# Patient Record
Sex: Female | Born: 1935 | Race: White | Hispanic: No | Smoking: Never smoker
Health system: Southern US, Community
[De-identification: ages and names within clinical notes are randomized; demographics above are authoritative.]

## PROBLEM LIST (undated history)

## (undated) DIAGNOSIS — N189 Chronic kidney disease, unspecified: Secondary | ICD-10-CM

## (undated) DIAGNOSIS — I10 Essential (primary) hypertension: Secondary | ICD-10-CM

## (undated) DIAGNOSIS — I639 Cerebral infarction, unspecified: Secondary | ICD-10-CM

## (undated) DIAGNOSIS — D509 Iron deficiency anemia, unspecified: Secondary | ICD-10-CM

## (undated) DIAGNOSIS — M545 Low back pain, unspecified: Secondary | ICD-10-CM

## (undated) DIAGNOSIS — E079 Disorder of thyroid, unspecified: Secondary | ICD-10-CM

## (undated) HISTORY — PX: TOTAL HIP ARTHROPLASTY: SHX124

## (undated) HISTORY — PX: TOTAL ABDOMINAL HYSTERECTOMY: SHX209

## (undated) HISTORY — PX: CATARACT EXTRACTION: SUR2

---

## 1998-04-22 ENCOUNTER — Ambulatory Visit (HOSPITAL_COMMUNITY): Admission: RE | Admit: 1998-04-22 | Discharge: 1998-04-22 | Payer: Self-pay | Admitting: *Deleted

## 1999-04-09 ENCOUNTER — Encounter: Payer: Self-pay | Admitting: Orthopaedic Surgery

## 1999-04-09 ENCOUNTER — Encounter: Admission: RE | Admit: 1999-04-09 | Discharge: 1999-04-09 | Payer: Self-pay | Admitting: Orthopaedic Surgery

## 1999-05-07 ENCOUNTER — Ambulatory Visit (HOSPITAL_COMMUNITY): Admission: RE | Admit: 1999-05-07 | Discharge: 1999-05-07 | Payer: Self-pay | Admitting: Orthopaedic Surgery

## 1999-05-07 ENCOUNTER — Encounter: Payer: Self-pay | Admitting: Orthopaedic Surgery

## 1999-05-20 ENCOUNTER — Ambulatory Visit (HOSPITAL_COMMUNITY): Admission: RE | Admit: 1999-05-20 | Discharge: 1999-05-20 | Payer: Self-pay | Admitting: *Deleted

## 1999-05-20 ENCOUNTER — Encounter: Payer: Self-pay | Admitting: Orthopaedic Surgery

## 1999-05-20 ENCOUNTER — Encounter: Admission: RE | Admit: 1999-05-20 | Discharge: 1999-05-20 | Payer: Self-pay | Admitting: Orthopaedic Surgery

## 1999-06-01 ENCOUNTER — Encounter: Payer: Self-pay | Admitting: Orthopaedic Surgery

## 1999-06-01 ENCOUNTER — Encounter: Admission: RE | Admit: 1999-06-01 | Discharge: 1999-06-01 | Payer: Self-pay | Admitting: Orthopaedic Surgery

## 1999-06-10 ENCOUNTER — Ambulatory Visit (HOSPITAL_COMMUNITY): Admission: RE | Admit: 1999-06-10 | Discharge: 1999-06-10 | Payer: Self-pay | Admitting: Gastroenterology

## 1999-06-10 ENCOUNTER — Encounter (INDEPENDENT_AMBULATORY_CARE_PROVIDER_SITE_OTHER): Payer: Self-pay | Admitting: *Deleted

## 1999-06-22 ENCOUNTER — Encounter: Payer: Self-pay | Admitting: General Surgery

## 1999-06-24 ENCOUNTER — Ambulatory Visit (HOSPITAL_COMMUNITY): Admission: RE | Admit: 1999-06-24 | Discharge: 1999-06-25 | Payer: Self-pay | Admitting: General Surgery

## 1999-06-24 ENCOUNTER — Encounter (INDEPENDENT_AMBULATORY_CARE_PROVIDER_SITE_OTHER): Payer: Self-pay | Admitting: *Deleted

## 1999-06-29 ENCOUNTER — Encounter (INDEPENDENT_AMBULATORY_CARE_PROVIDER_SITE_OTHER): Payer: Self-pay | Admitting: Specialist

## 1999-06-29 ENCOUNTER — Inpatient Hospital Stay (HOSPITAL_COMMUNITY): Admission: RE | Admit: 1999-06-29 | Discharge: 1999-07-04 | Payer: Self-pay | Admitting: General Surgery

## 2000-05-24 ENCOUNTER — Encounter: Payer: Self-pay | Admitting: Internal Medicine

## 2000-05-24 ENCOUNTER — Ambulatory Visit (HOSPITAL_COMMUNITY): Admission: RE | Admit: 2000-05-24 | Discharge: 2000-05-24 | Payer: Self-pay | Admitting: Internal Medicine

## 2000-06-29 ENCOUNTER — Encounter (INDEPENDENT_AMBULATORY_CARE_PROVIDER_SITE_OTHER): Payer: Self-pay | Admitting: *Deleted

## 2000-06-29 ENCOUNTER — Ambulatory Visit (HOSPITAL_COMMUNITY): Admission: RE | Admit: 2000-06-29 | Discharge: 2000-06-29 | Payer: Self-pay | Admitting: Gastroenterology

## 2000-07-20 ENCOUNTER — Encounter: Admission: RE | Admit: 2000-07-20 | Discharge: 2000-07-31 | Payer: Self-pay | Admitting: General Surgery

## 2000-07-20 ENCOUNTER — Encounter: Payer: Self-pay | Admitting: General Surgery

## 2000-07-27 ENCOUNTER — Inpatient Hospital Stay (HOSPITAL_COMMUNITY): Admission: RE | Admit: 2000-07-27 | Discharge: 2000-07-31 | Payer: Self-pay | Admitting: General Surgery

## 2000-07-27 ENCOUNTER — Encounter (INDEPENDENT_AMBULATORY_CARE_PROVIDER_SITE_OTHER): Payer: Self-pay | Admitting: Specialist

## 2001-01-04 ENCOUNTER — Ambulatory Visit (HOSPITAL_COMMUNITY): Admission: RE | Admit: 2001-01-04 | Discharge: 2001-01-04 | Payer: Self-pay | Admitting: Gastroenterology

## 2001-03-20 ENCOUNTER — Other Ambulatory Visit: Admission: RE | Admit: 2001-03-20 | Discharge: 2001-03-20 | Payer: Self-pay | Admitting: *Deleted

## 2001-05-25 ENCOUNTER — Encounter: Payer: Self-pay | Admitting: Internal Medicine

## 2001-05-25 ENCOUNTER — Ambulatory Visit (HOSPITAL_COMMUNITY): Admission: RE | Admit: 2001-05-25 | Discharge: 2001-05-25 | Payer: Self-pay | Admitting: Internal Medicine

## 2002-04-02 ENCOUNTER — Ambulatory Visit (HOSPITAL_COMMUNITY): Admission: RE | Admit: 2002-04-02 | Discharge: 2002-04-02 | Payer: Self-pay | Admitting: Gastroenterology

## 2002-05-27 ENCOUNTER — Ambulatory Visit (HOSPITAL_COMMUNITY): Admission: RE | Admit: 2002-05-27 | Discharge: 2002-05-27 | Payer: Self-pay | Admitting: Internal Medicine

## 2002-05-27 ENCOUNTER — Encounter: Payer: Self-pay | Admitting: Internal Medicine

## 2003-04-23 ENCOUNTER — Ambulatory Visit (HOSPITAL_COMMUNITY): Admission: RE | Admit: 2003-04-23 | Discharge: 2003-04-23 | Payer: Self-pay | Admitting: Gastroenterology

## 2003-04-23 ENCOUNTER — Encounter (INDEPENDENT_AMBULATORY_CARE_PROVIDER_SITE_OTHER): Payer: Self-pay | Admitting: *Deleted

## 2003-05-29 ENCOUNTER — Ambulatory Visit (HOSPITAL_COMMUNITY): Admission: RE | Admit: 2003-05-29 | Discharge: 2003-05-29 | Payer: Self-pay | Admitting: Internal Medicine

## 2004-06-21 ENCOUNTER — Ambulatory Visit (HOSPITAL_COMMUNITY): Admission: RE | Admit: 2004-06-21 | Discharge: 2004-06-21 | Payer: Self-pay | Admitting: Internal Medicine

## 2004-07-30 ENCOUNTER — Encounter: Admission: RE | Admit: 2004-07-30 | Discharge: 2004-10-28 | Payer: Self-pay | Admitting: Internal Medicine

## 2004-10-29 ENCOUNTER — Ambulatory Visit (HOSPITAL_COMMUNITY): Admission: RE | Admit: 2004-10-29 | Discharge: 2004-10-29 | Payer: Self-pay | Admitting: Gastroenterology

## 2005-07-14 ENCOUNTER — Ambulatory Visit (HOSPITAL_COMMUNITY): Admission: RE | Admit: 2005-07-14 | Discharge: 2005-07-14 | Payer: Self-pay | Admitting: Internal Medicine

## 2006-08-24 ENCOUNTER — Ambulatory Visit (HOSPITAL_COMMUNITY): Admission: RE | Admit: 2006-08-24 | Discharge: 2006-08-24 | Payer: Self-pay | Admitting: Internal Medicine

## 2006-12-24 ENCOUNTER — Encounter: Admission: RE | Admit: 2006-12-24 | Discharge: 2006-12-24 | Payer: Self-pay | Admitting: Internal Medicine

## 2007-10-10 ENCOUNTER — Encounter: Admission: RE | Admit: 2007-10-10 | Discharge: 2007-10-10 | Payer: Self-pay | Admitting: Specialist

## 2007-10-16 ENCOUNTER — Ambulatory Visit (HOSPITAL_COMMUNITY): Admission: RE | Admit: 2007-10-16 | Discharge: 2007-10-16 | Payer: Self-pay | Admitting: Internal Medicine

## 2007-10-22 ENCOUNTER — Encounter: Admission: RE | Admit: 2007-10-22 | Discharge: 2007-10-22 | Payer: Self-pay | Admitting: Internal Medicine

## 2008-11-10 ENCOUNTER — Ambulatory Visit (HOSPITAL_COMMUNITY): Admission: RE | Admit: 2008-11-10 | Discharge: 2008-11-10 | Payer: Self-pay | Admitting: Internal Medicine

## 2010-06-27 ENCOUNTER — Encounter: Payer: Self-pay | Admitting: Internal Medicine

## 2010-08-10 ENCOUNTER — Other Ambulatory Visit (HOSPITAL_COMMUNITY): Payer: Self-pay | Admitting: Internal Medicine

## 2010-08-10 DIAGNOSIS — Z1231 Encounter for screening mammogram for malignant neoplasm of breast: Secondary | ICD-10-CM

## 2010-08-17 ENCOUNTER — Ambulatory Visit (HOSPITAL_COMMUNITY)
Admission: RE | Admit: 2010-08-17 | Discharge: 2010-08-17 | Disposition: A | Discharge status: Home / Self Care | Payer: Medicare Other | Source: Ambulatory Visit | Attending: Internal Medicine | Admitting: Internal Medicine

## 2010-08-17 DIAGNOSIS — Z1231 Encounter for screening mammogram for malignant neoplasm of breast: Secondary | ICD-10-CM | POA: Insufficient documentation

## 2010-10-22 NOTE — Op Note (Signed)
NAME:  Brandy Barnett, Brandy Barnett                          ACCOUNT NO.:  [id number]   MEDICAL RECORD NO.:  [id number]                   PATIENT TYPE:  AMB   LOCATION:  ENDO                                 FACILITY:  [facility]   PHYSICIAN:  Anselmo Rod, M.D.               DATE OF BIRTH:  [birth year]   DATE OF PROCEDURE:  04/23/2003  DATE OF DISCHARGE:                                 OPERATIVE REPORT   PROCEDURE:  Colonoscopy with biopsies.   ENDOSCOPIST:  Charna Elizabeth, M.D.   INSTRUMENT USED:  Olympus video colonoscope.   INDICATIONS FOR PROCEDURE:  77 year old white female with a history of stage  II colon cancer and a personal history of colonic polyps resulting in  sigmoid colectomy and right sided adenomatous polyp resulting in right sided  colectomy in 2001 and 2002 respectively.  The patient is undergoing repeat  colonoscopy for blood in stool, rule out recurrent polyps.   PREPROCEDURE PREPARATION:  Informed consent was obtained from the patient.  The patient was fasted for eight hours prior to the procedure and prepped  with a bottle of magnesium citrate and a gallon of GoLYTELY the night prior  to the procedure.   PREPROCEDURE PHYSICAL:  Patient with stable vital signs.  Neck supple.  Chest clear to auscultation.  S1 and S2 regular.  Abdomen soft with normal  bowel sounds.   DESCRIPTION OF PROCEDURE:  The patient was placed in the left lateral  decubitus position, sedated with 60 mg of Demerol and 6 mg Versed  intravenously.  Once the patient was adequately sedated, maintained on low  flow oxygen, continuous cardiac monitoring, the Olympus video colonoscope  was advanced into the rectum to the anastomosis at 100 cm without  difficulty.  The patient had some residual stool in the colon, multiple  washings were done.  Healthy anastomosis was noted at 25 and 100 cm from the  sigmoid colectomy and right colectomy respectively.  The distal small bowel  appeared normal.  A small patch of  erythema was biopsied at 80 cm.  Small  internal hemorrhoids were seen on retroflexion.  The patient has some skin  tags around the anus.  No other masses or polyps are identified.  The  patient tolerated the procedure well without complications.   IMPRESSION:  1. Healthy anastomosis noted at 25 and 100 cm.  2. Small patch of erythema biopsied at 80 cm.  3. Small internal hemorrhoids.  4. Anal skin tags.   RECOMMENDATIONS:  1. Await pathology results  2. Avoid all nonsteroidals including aspirin for now.  3. Outpatient follow up in the next two weeks for further recommendations.  Anselmo Rod, M.D.    JNM/MEDQ  D:  04/23/2003  T:  04/24/2003  Job:  [id number]   cc:   Adolph Pollack, M.D.  [address], [address]  [place]  [place] [address]  Fax: [phone]   Soyla Murphy. Renne Crigler, M.D.  [address]  [place]  [place] [address]  Fax: [phone]

## 2010-10-22 NOTE — Procedures (Signed)
[place]. [facility]  Patient:    Brandy Barnett, Brandy Barnett                       MRN: [id number] Proc. Date: 01/04/01 Attending:  Anselmo Rod, M.D. CC:         Jenel Lucks, M.D.  Adolph Pollack, M.D.   Procedure Report  DATE OF BIRTH:  11/30/1932  REFERRING PHYSICIAN:  Jenel Lucks, M.D.  PROCEDURE PERFORMED:  Colonoscopy to rule out recurrent polyps.  ENDOSCOPIST:  Anselmo Rod, M.D.  INSTRUMENT USED:  Olympus video colonoscope.  INDICATIONS FOR PROCEDURE:  The patient is a 77 year old white female who has had left hemicolectomy for invasive adenocarcinoma and a right hemicolectomy with side-to-side anastomosis for villous adenoma of the cecum.  The patient also had a portion of her distal small bowel resected.  PREPROCEDURE PREPARATION:  Informed consent was procured from the patient. The patient was fasted for eight hours prior to the procedure and prepped with a bottle of magnesium citrate and a gallon of NuLytely the night prior to the procedure.  PREPROCEDURE PHYSICAL:  The patient had stable vital signs.  Neck supple. Chest clear to auscultation.  S1, S2 regular.  Abdomen soft with normal abdominal bowel sounds.  There is a well-healed surgical scar from previous surgeries.  DESCRIPTION OF PROCEDURE:  The patient was placed in the left lateral decubitus position and sedated with 50 mg of Demerol and 5 mg of Versed intravenously.  Once the patient was adequately sedated and maintained on low-flow oxygen and continuous cardiac monitoring, the Olympus video colonoscope was advanced from the rectum to the right colon up to 80 cm without difficulty.  The patient had a fairly good prep.  A healthy anastomosis was seen at 80 cm with side-to-side anastomosis of the terminal portion of the small bowel intubated and appeared healthy and without lesions.  IMPRESSION:  Healthy-appearing colon.  Patient status post right and left hemicolectomy  for reasons mentioned above.  RECOMMENDATIONS:  Repeat colorectal cancer screening has been recommended in the next year and then follow-up in the next three years unless the patient were to develop any abnormal symptoms in the interim. DD:  01/04/01 TD:  01/04/01 Job: [id number]

## 2010-10-22 NOTE — Procedures (Signed)
[place]. [facility]  Patient:    Brandy Barnett                          MRN: [id number] Proc. Date: 06/10/99 Adm. Date:  [id number] Attending:  Charna Elizabeth CC:         Warrick Parisian, M.D.                           Procedure Report  DATE OF BIRTH:  Aug 02, 1932  REFERRING PHYSICIAN:  Warrick Parisian, M.D.  PROCEDURE PERFORMED:  Colonoscopy with biopsies.  ENDOSCOPIST:  Anselmo Rod, M.D.  INSTRUMENT USED:  Olympus video colonoscope.  INDICATIONS:  Rectal bleeding in a 77 year old white female.  Rule out polyps, masses, hemorrhoids, etc.  PREPROCEDURE PREPARATION:  Informed consent was procured from the patient.  The  patient was fasted for 8 hours prior to the procedure and prepped with a bottle of magnesium citrate and a gallon of NuLytely the night prior to the procedure.  PREPROCEDURE PHYSICAL:  Patient has stable vital signs.  NECK: Supple.  CHEST:  Clear to auscultation. S1, S2 regular.  ABDOMEN:  Soft with normal abdominal bowel sounds.  DESCRIPTION OF PROCEDURE:  The patient was placed in left lateral decubitus position and sedated with 50 mg of Demerol and 5 mg of Versed intravenously. nce the patient was adequately sedated and maintained on low-flow oxygen and continuous cardiac monitoring, the Olympus video colonoscope was advanced from the rectum o the cecum without difficulty.  An inflamed 3-4 mm area was seen in the left colon around 30 cm.  This was biopsied for pathology.  There some blood oozing from the center of this lesion.  The exact nature of this lesion could not be estimated y gross looks of this lesion and the rest of the colonic mucosa appeared healthy p to the cecum.  The patient had some redundant skin around the anus.  No other abnormalities were seen.  Biopsies were done from this lesion at 30 cm.  The patient tolerated the procedure well.  There was a large amount of residual stool in  dependent areas of the colon, therefore, very small lesions could have been easily missed.  IMPRESSION:  A 3-4 mm ______ lesion in left colon at 30 cm with easy friability  and small amount of blood oozing from the center of the lesion.   Biopsies done, results pending.  RECOMMENDATIONS: 1. Await pathology results. 2. Avoid nonsteroidals for now. 3. Outpatient follow-up for further recommendations. DD:  06/10/99 TD:  06/10/99 Job: [id number]

## 2010-10-22 NOTE — Op Note (Signed)
NAME:  Brandy Barnett, Brandy Barnett                          ACCOUNT NO.:  [id number]   MEDICAL RECORD NO.:  [id number]                   PATIENT TYPE:  AMB   LOCATION:  ENDO                                 FACILITY:  [facility]   PHYSICIAN:  Anselmo Rod, M.D.               DATE OF BIRTH:  02/19/1933   DATE OF PROCEDURE:  04/02/2002  DATE OF DISCHARGE:                                 OPERATIVE REPORT   PROCEDURE:  Screening colonoscopy.   ENDOSCOPIST:  Anselmo Rod, M.D.   INSTRUMENT USED:  Olympus video colonoscope (pediatric intestinal scope).   INDICATION FOR PROCEDURE:  Personal history of adenocarcinoma of the colon,  status post sigmoid colectomy in 2001, followed by a right colectomy for  large adenomatous polyp in 2002.  The patient is here for a one-year  screening colonoscopy.  Rule out colonic polyps, etc.   PREPROCEDURE PREPARATION:  Informed consent was procured from the patient.  The patient was fasted for eight hours prior to the procedure and prepped  with a bottle of magnesium citrate and a gallon of NuLytely the night prior  to the procedure.   PREPROCEDURE PHYSICAL:  VITAL SIGNS:  The patient had stable vital signs.  NECK:  Supple.  CHEST:  Clear to auscultation.  S1, S2 regular.  ABDOMEN:  Soft with normal bowel sounds.  Well-healed surgical scars present  from above-mentioned surgeries.   DESCRIPTION OF PROCEDURE:  The patient was placed in the left lateral  decubitus position and sedated with 70 mg of Demerol and 7 mg of Versed  intravenously.  Once the patient was adequately sedate and maintained on low-  flow oxygen and continuous cardiac monitoring, the Olympus video colonoscope  was advanced from the rectum to the terminal ileum without difficulty.  A  well-healed anastomosis was noted at 20 cm, and a healthy anastomosis was  noted at 90 cm.  The patient had a terminal ileum and right colon resection  last year.  No polyps, masses, erosions, ulcerations, etc.,  were seen.   IMPRESSION:  A healthy-appearing colon with anastomoses noted at 20 cm and  90 cm.  No masses or polyps seen.   RECOMMENDATIONS:  1. Considering the patient's history of adenomatous polyps and     adenocarcinoma, repeat colorectal cancer     screening is recommended in the next three years unless the patient     develops any abnormal symptoms in the interim.  2. Outpatient follow-up on a p.r.n. basis.                                               Anselmo Rod, M.D.    JNM/MEDQ  D:  04/02/2002  T:  04/02/2002  Job:  [id number]   cc:   Soyla Murphy.  Renne Crigler, M.D.  [address]  [place]  [place] [address]  Fax: [phone]   Adolph Pollack, M.D.  Fax: [phone]

## 2010-10-22 NOTE — Procedures (Signed)
[place]. [facility]  Patient:    Brandy Barnett                          MRN: [id number] Proc. Date: 06/24/99 Adm. Date:  [id number] Attending:  Arlis Porta CC:         Warrick Parisian, M.D.             Adolph Pollack, M.D.                           Procedure Report  DATE OF BIRTH:  07/05/1932  PROCEDURE PERFORMED:  Flexible sigmoidoscopy with biopsies.  ENDOSCOPIST:  Anselmo Rod, M.D.  INSTRUMENT USED:  Olympus video colonoscope.  INDICATIONS:  This 77 year old white female with invasive adenocarcinoma diagnosed in the left colon.  Plans are to tattoo this area prior to surgery which is scheduled for later today.  PREPROCEDURE PREPARATION:  An informed consent was procured from the patient and the patient was fasted for eight hours prior to the procedure, and prepped with a gallon of NuLYTELY on the night prior to the procedure.  PREPROCEDURE PHYSICAL EXAMINATION:  VITAL SIGNS:  The patient had stable vital signs.  NECK:  Supple.  CHEST:  Clear to auscultation.  HEART:  S1, S2 regular.  ABDOMEN:  Soft with normal abdominal bowel sounds.  DESCRIPTION OF PROCEDURE:  The patient was placed in the left lateral decubitus  position and sedated with 60 mg of Demerol and 4 mg of Versed intravenously. Once the patient was adequately sedated and maintained on low-flow oxygen and continuous cardiac monitoring, the Olympus video colonoscope was advanced into the rectum o the hepatic flexure, with difficulty secondary to a large amount of residual stool in the colon.  A small sessile polyp was seen at 100.0 cm.  It was biopsied for  pathology.  A small nodular area was seen at about 40.0 cm that was tattooed with [place] ink.  A small bleb was made at the site of injection to facilitate easy identification of this area.  The rest of the transverse colon and left colon appeared normal.  Initially we had a hard time  identifying this nodular area at  40.0 cm, because of a large amount of stool; however, later this area was identified and tattooed as mentioned above.  The procedure was continued up to he hepatic flexure.  The patient tolerated the procedure well without complications.  IMPRESSION: 1. Small polyp at 100.0 cm, biopsied for pathology. 2. Nodular area in the left colon at 40.0 cm, tattooed with [place] ink.    This is the probable site of the adenocarcinoma.  DISPOSITION:  The results of this procedure have been discussed with Dr. Adolph Pollack, who will make further plans with regards to the patients surgery. DD:  06/24/99 TD:  06/24/99 Job: [id number]

## 2010-10-22 NOTE — Discharge Summary (Signed)
[facility]  Patient:    Brandy Barnett, Brandy Barnett                           MRN: [id number] Adm. Date:  07/27/00 Disc. Date: 07/31/00 Attending:  Adolph Pollack, M.D. CC:         Anselmo Rod, M.D.  Jenel Lucks, M.D.   Discharge Summary  PRINCIPAL DISCHARGE DIAGNOSIS:  Adenomatous polyp of the right colon.  SECONDARY DIAGNOSES: 1. Hypertension. 2. Hyperlipidemia. 3. Hypokalemia. 4. Hypoglycemia. 5. Previous history of colon cancer.  REASON FOR ADMISSION:  Ms. Gunnerson is a 77 year old female, status post sigmoid colectomy for stage I colon cancer approximately one year ago.  She had a follow-up colonoscopy performed and was noted to have multiple polyps in the right colon, including a large polyp which could not be completely excised. These were adenomatous.  Because of her previous history and the inability to totally excise the polyp, she as brought to the operating room.  She did request that she had a bilateral salpingo-oophorectomy at the time of the operation as well.  HOSPITAL COURSE:  She underwent the above procedures, right colectomy and bilateral salpingo-oophorectomy, without complications on July 27, 2000. She had a relatively unremarkable postoperative course.  On her first postoperative day, she was started on a liquid diet.  Then she was advanced to a solid diet by postoperative day #2.  She was ambulatory and by postoperative day #3, she was passing gas.  By postoperative day #4, she tolerated all of her solid diet, she continued to pass gas, was voiding well, ambulating independently, and ready for discharge.  DISPOSITION:  Discharged to home on July 31, 2000, in satisfactory condition.  DIET:  No concentrated sweets.  DISCHARGE MEDICATIONS:  She was given Vicodin for pain and told to take all of her prehospital medications.  ACTIVITY:  Limited to no heavy lifting, no straining, and no driving.  FOLLOW-UP:  I will see  her back in the office in four days for staple removal. She is told to call before then if he has any problems.  She was a patient in the Adolor study protocol DD:  07/31/00 TD:  08/01/00 Job: [id number]

## 2010-10-22 NOTE — Procedures (Signed)
[place]. [facility]  Patient:    Brandy Barnett, Brandy Barnett                       MRN: [id number] Proc. Date: 06/29/00 Adm. Date:  06/29/00 Attending:  Anselmo Rod, M.D. CC:         Jenel Lucks, M.D.  Leighton Roach. Truett Perna, M.D.  Adolph Pollack, M.D.   Procedure Report  DATE OF BIRTH:  03/25/1933  PROCEDURE PERFORMED:  Colonoscopy with biopsies.  ENDOSCOPIST:  Anselmo Rod, M.D.  INSTRUMENT USED:  Olympus video colonoscope.  INDICATION FOR PROCEDURE:  Personal history of colon cancer status post left colon resection a year ago in a 77 year old white female rule out recurrent polyps.  PREPROCEDURE PREPARATION:  Informed consent was procured from the patient. The patient was fasted for eight hours prior to the procedure and prepped with a bottle of magnesium citrate and a gallon of NuLytely the night prior to the procedure.  PREPROCEDURE PHYSICAL:  VITAL SIGNS:  The patient had stable vital signs.  NECK:  Supple.  CHEST: Clear to auscultation.  S1 and S2 regular.  ABDOMEN: Soft with a well healed surgical scar from previous colectomy.  DESCRIPTION OF PROCEDURE:  The patient was placed in the left lateral decubitus position and sedated with 50 mg of Demerol and 5 mg of Versed intravenously.  Once the patient was adequately sedated and maintained on low flow oxygen and continuous cardiac monitoring, the Olympus video colonoscope was advanced from the rectum to the cecum without difficulty.  Healthy anastomosis was noted at 30 cm.  The IC valve appeared somewhat "flashy." Multiple biopsies were done from the IC valve to rule out lipoma.  A broad based polyp was seen in the right colon just distal to the cecum.  Attempts were made to snare the polyp but the base of the polyp was 3.5 cm thick and therefore biopsies were done and the polyp was not removed.  Over 12 biopsies were procured from this broad based polyp to rule out a villous adenoma  with adenocarcinoma.  Biopsies were put in bottle #2.  A small flat polyp was biopsies just distal to the above mentioned polyp and biopsies were put in bottle #3.  No other abnormalities were seen.  The patient tolerated the procedure well without complications.  IMPRESSION: 1. Multiple colonic polyps in the right colon. 2. Healthy appearing anastomosis at 30 cm. 3. No other abnormalities seen.  RECOMMENDATIONS: 1. Await pathology results. 2. Avoid nonsteroidals for now. 3. Outpatient follow up in the next two weeks.  I will discuss these findings with Dr. Avel Peace for further recommendations by him. DD:  06/29/00 TD:  06/29/00 Job: [id number]

## 2010-10-22 NOTE — Op Note (Signed)
[facility]  Patient:    Brandy Barnett, Brandy Barnett                       MRN: [id number] Proc. Date: 07/27/00 Adm. Date:  [id number] Attending:  Arlis Porta CC:         Anselmo Rod, M.D.  Jenel Lucks, M.D.   Operative Report  PREOPERATIVE DIAGNOSES: 1. Adenomatous polyp, right colon. 2. History of colon cancer.  POSTOPERATIVE DIAGNOSES: 1. Adenomatous polyp, right colon. 2. History of colon cancer.  OPERATION: 1. Right colectomy with resection of distal ileum. 2. Bilateral salpingo-oophorectomy.  SURGEON:  Adolph Pollack, M.D.  ASSISTANT:  Milus Mallick, M.D.  ANESTHESIA:  General.  INDICATIONS:  Brandy Barnett is a 77 year old female who, approximately one year ago, underwent a sigmoid colectomy for a stage I colon cancer.  She returned for her one-year colonoscopy after her operation and was noted to have multiple polyps in the right colon and a large polyp that was reportedly about 3 to 3.5 cm and was not able to be removed completely.  The biopsy demonstrated adenomatous polyp; but, because of its incomplete removal and her history of colon cancer, she is now brought to the operating room.  She also requested that, because of her history of colon cancer and small chance of metastasis to the ovaries, that she have both of her ovaries removed.  TECHNIQUE:  She is placed supine on the operating table, and a general anesthetic was administered.  Her abdomen was sterilely prepped and draped. Beginning above the umbilicus, a midline incision was made, and I did excise some of the previous lower midline scar.  Subcutaneous tissue and fascia were divided with the cautery, and the peritoneal cavity was entered under direct vision.  Omental adhesions to the anterior abdominal wall were taken down with the cautery.  The abdomen was explored.  On looking at the previous resection site, it looked clean.  The liver was soft without lesions.   Gallbladder was normal.  The white line of [place] was noted on the right side of the cecum, and this was mobilized by incising this.  The hepatic flexure was then mobilized across to the proximal third of the transverse colon.  I palpated the middle colic vessels.  I then created a defect just to the right of them in the colonic mesentery and divided the transverse colon here.  I created a defect at the distal ileum proximal to the ileocecal valve and then divided the ileum here. The mesenteric vessels were divided between clamps and ligated with ties.  The specimen was taken off the field and opened up, and I noted a polyp near the ileocecal valve.  I also noted a very generous ileocecal valve.  This was then sent to pathology for evaluation.  Next, the distal ileum was approximated in a side-to-side fashion with the transverse colon, and stapled anastomosis was performed.  The anastomosis was patent, viable, and under no tension.  Subsequent mesenteric defect was closed with interrupted Vicryl sutures.  I then changed gloves and irrigated out the area, and no obvious bleeding was noted.  I then approached the right ovary.  The right ovary and tube were adherent to the posterior abdominal wall.  The adhesions were taken down sharply with the cautery.  I then ligated the ovarian vessels individually and removed the right ovary.  The ureter was identified and was not harmed.  Next, the left ovarian area  was approached and the fallopian tube grasped.  I again used the cautery and scissors to preempt the ovary from adhesions.  A stayed well above the ureter and ligated and divided the individual vessels and removed the left ovary and tube.  These were also passed off to pathology.  The pelvic area was irrigated, and no bleeding was noted.  I once again inspected the right gutter area and irrigated it, and no bleeding was noted.  I subsequently closed the midline fascia with a running #1  PDS suture.  The subcutaneous tissue was irrigated, and the skin was closed with staples.  She tolerated the procedure well without any apparent complications and was taken to the recovery room in satisfactory condition. DD:  07/27/00 TD:  07/28/00 Job: [id number]

## 2010-10-22 NOTE — Op Note (Signed)
NAMEPANTERA, WINTERROWD NO.:  [id number]   MEDICAL RECORD NO.:  [id number]          PATIENT TYPE:  AMB   LOCATION:  ENDO                         FACILITY:  [facility]   PHYSICIAN:  Anselmo Rod, M.D.  DATE OF BIRTH:  May 29, 1933   DATE OF PROCEDURE:  10/29/2004  DATE OF DISCHARGE:                                 OPERATIVE REPORT   PROCEDURE PERFORMED:  Screening colonoscopy.   ENDOSCOPIST:  Charna Elizabeth, M.D.   INSTRUMENT USED:  Olympus video colonoscope.   INDICATIONS FOR PROCEDURE:  A 77 year old white female with a history of  stage 1 colon cancer status post sigmoid colectomy in 2001 and a right  hemicolectomy for villous adenoma subsequently in 2002. Undergoing a repeat  colonoscopy for surveillance purposes.  Rule out recurrent polyps, masses,  etc.   PREPROCEDURE PREPARATION:  Informed consent was procured from the patient.  The patient was fasted for eight hours prior to the procedure and prepped  with a bottle of magnesium citrate and a gallon of GoLYTELY the night prior  to the procedure.  The risks and benefits of the procedure including a 10%  miss rate for colon polyps or cancers was discussed with the patient as  well.   PREPROCEDURE PHYSICAL:  The patient had stable vital signs.  Neck supple.  Chest clear to auscultation.  S1 and S2 regular.  Abdomen soft with normal  bowel sounds.   DESCRIPTION OF PROCEDURE:  The patient was placed in left lateral decubitus  position and sedated with 5 mg of Demerol and 6 mg of Versed in slow  incremental doses considering her history of obstructive sleep apnea.  Once  the patient was adequately sedated and maintained on low flow oxygen and  continuous cardiac monitoring, the Olympus video colonoscope was advanced  from the rectum to 110 cm without difficulty.  A healthy anastomosis was  seen on the right side. There was some stool seen in the dependent areas of  the colon.  No masses, polyps, erosions,  ulcerations or diverticula were  identified.  Retroflexion in the rectum revealed small nonbleeding internal  hemorrhoids.  Small external hemorrhoids were seen on anal inspection.  An  healthy anastomosis was seen at 20 cm as well from the  sigmoid colectomy.  The patient tolerated the procedure well without immediate complication.   IMPRESSION:  1. Healthy anastomosis both at 20 cm and 110 cm.  The patient has had both      sigmoid colectomy and a right hemicolectomy for colon cancer and      villous adenoma respectively.  2. Small internal and external hemorrhoids.  3. No masses or polyps seen.     RECOMMENDATIONS:  1. Continue high fiber diet with liberal fluid intake.  2. Repeat colonoscopy is recommended in the next three years unless the      patient develops any abnormal symptoms in the interim.  3. Outpatient followup as need arises in the future.        JNM/MEDQ  D:  10/29/2004  T:  10/29/2004  Job:  [id number]  cc:   Soyla Murphy. Renne Crigler, M.D.  [address]  [place]  [place] [address]  Fax: [phone]   Adolph Pollack, M.D.  [address]  [place]  [place] [address]   Leighton Roach. Truett Perna, M.D.  [address] [facility]  [place]  [place] [address]  Fax: [phone]

## 2010-12-12 ENCOUNTER — Emergency Department (INDEPENDENT_AMBULATORY_CARE_PROVIDER_SITE_OTHER): Payer: Medicare Other

## 2010-12-12 ENCOUNTER — Emergency Department (HOSPITAL_BASED_OUTPATIENT_CLINIC_OR_DEPARTMENT_OTHER)
Admission: EM | Admit: 2010-12-12 | Discharge: 2010-12-12 | Disposition: A | Discharge status: Other Acute Inpatient Hospital | Payer: Medicare Other | Attending: Emergency Medicine | Admitting: Emergency Medicine

## 2010-12-12 ENCOUNTER — Other Ambulatory Visit: Payer: Self-pay

## 2010-12-12 ENCOUNTER — Emergency Department (HOSPITAL_BASED_OUTPATIENT_CLINIC_OR_DEPARTMENT_OTHER): Payer: Medicare Other

## 2010-12-12 ENCOUNTER — Encounter (HOSPITAL_BASED_OUTPATIENT_CLINIC_OR_DEPARTMENT_OTHER): Payer: Self-pay | Admitting: Emergency Medicine

## 2010-12-12 DIAGNOSIS — K449 Diaphragmatic hernia without obstruction or gangrene: Secondary | ICD-10-CM

## 2010-12-12 DIAGNOSIS — M542 Cervicalgia: Secondary | ICD-10-CM | POA: Insufficient documentation

## 2010-12-12 DIAGNOSIS — I6529 Occlusion and stenosis of unspecified carotid artery: Secondary | ICD-10-CM

## 2010-12-12 DIAGNOSIS — R55 Syncope and collapse: Secondary | ICD-10-CM | POA: Insufficient documentation

## 2010-12-12 DIAGNOSIS — I658 Occlusion and stenosis of other precerebral arteries: Secondary | ICD-10-CM

## 2010-12-12 DIAGNOSIS — E049 Nontoxic goiter, unspecified: Secondary | ICD-10-CM

## 2010-12-12 DIAGNOSIS — M47812 Spondylosis without myelopathy or radiculopathy, cervical region: Secondary | ICD-10-CM

## 2010-12-12 DIAGNOSIS — I959 Hypotension, unspecified: Secondary | ICD-10-CM | POA: Insufficient documentation

## 2010-12-12 HISTORY — DX: Cerebral infarction, unspecified: I63.9

## 2010-12-12 HISTORY — DX: Disorder of thyroid, unspecified: E07.9

## 2010-12-12 HISTORY — DX: Essential (primary) hypertension: I10

## 2010-12-12 LAB — URINE MICROSCOPIC-ADD ON

## 2010-12-12 LAB — CK TOTAL AND CKMB (NOT AT ARMC)
CK, MB: 1.8 ng/mL (ref 0.3–4.0)
Relative Index: INVALID (ref 0.0–2.5)

## 2010-12-12 LAB — BASIC METABOLIC PANEL
GFR calc Af Amer: >60 mL/min (ref >60)
GFR calc non Af Amer: 54 mL/min — ABNORMAL LOW (ref >60)
Potassium: 4.3 mEq/L (ref 3.5–5.1)
Sodium: 139 mEq/L (ref 135–145)

## 2010-12-12 LAB — CBC
MCH: 23 pg — ABNORMAL LOW (ref 26.0–34.0)
MCHC: 32 g/dL (ref 30.0–36.0)
Platelets: 217 10*3/uL (ref 150–400)
RBC: 3.87 MIL/uL (ref 3.87–5.11)

## 2010-12-12 LAB — URINALYSIS, ROUTINE W REFLEX MICROSCOPIC
Bilirubin Urine: NEGATIVE
Hgb urine dipstick: NEGATIVE
Ketones, ur: NEGATIVE mg/dL
Protein, ur: NEGATIVE mg/dL
Urobilinogen, UA: 0.2 mg/dL (ref 0.0–1.0)

## 2010-12-12 LAB — DIFFERENTIAL
Basophils Relative: 0 % (ref 0–1)
Eosinophils Absolute: 0.2 10*3/uL (ref 0.0–0.7)
Lymphs Abs: 2.1 10*3/uL (ref 0.7–4.0)
Neutrophils Relative %: 65 % (ref 43–77)

## 2010-12-12 MED ORDER — IOHEXOL 350 MG/ML SOLN: 100.0000 mL | Freq: Once | INTRAVENOUS | Status: DC | PRN

## 2010-12-12 MED ORDER — SODIUM CHLORIDE 0.9 % IV SOLN
Freq: Once | INTRAVENOUS | Status: AC
  Administered 2010-12-12: 15:00:00 via INTRAVENOUS

## 2010-12-12 NOTE — ED Notes (Signed)
EKG has been done on Pt.  Duplicate order.

## 2010-12-12 NOTE — ED Notes (Signed)
Pt. Had contrast in radiology with Metformin post contrast orders due to Pt. Is on Metformin.  RN Earlene Plater to give to Pt. With explanation.

## 2010-12-12 NOTE — ED Notes (Signed)
Pt. Is being cared for by RT Crystal at present time.  NO distress noted in Pt.

## 2010-12-12 NOTE — ED Notes (Signed)
ZOX:[id number]<AV> Expected date:12/12/10<BR> Expected time:10:03 AM<BR> Means of arrival:Ambulance<BR> Comments:<BR> EMS 77y/o orthostatic now normal

## 2010-12-12 NOTE — ED Notes (Signed)
Per EMS:  Pt had syncopal episode at church today.  EMS states pt had some orthostatic changes.  Pt took flexeril this morning.  IV established.

## 2010-12-12 NOTE — ED Provider Notes (Signed)
History     Chief Complaint  Patient presents with  . Near Syncope   Patient is a 77 y.o. female presenting with syncope. The history is provided by the patient. No language interpreter was used.  Loss of Consciousness This is a new problem. The current episode started 1 to 2 hours ago. Episode frequency: once. The problem has been resolved. Pertinent negatives include no chest pain, no abdominal pain, no headaches and no shortness of breath. Associated symptoms comments: No abdominal pain, no focal neurologic symptons, no f/c/r.  No headache. The symptoms are aggravated by nothing. The symptoms are relieved by nothing. She has tried nothing for the symptoms.  Patient states she has chronic degenerative neck pain and this am she was having a flare so she took one flexeril and then got ready for church.  During the service the patient reported feeling like she was lightheaded.  She excused herself.  She did not pass out but felt like she might.  EMS was called and found the patient to be orthostatic on their initial assessment.  Patient transported for further eval.  Orthostasis improved with fluid.    Past Medical History  Diagnosis Date  . Hypertension   . Diabetes mellitus     Past Surgical History  Procedure Date  . Joint replacement     History reviewed. No pertinent family history.  History  Substance Use Topics  . Smoking status: Not on file  . Smokeless tobacco: Not on file  . Alcohol Use: No    OB History    Grav Para Term Preterm Abortions TAB SAB Ect Mult Living                Review of patient's allergies indicates no known allergies.  Review of Systems  Constitutional: Negative for fever, chills, activity change and appetite change.  HENT: Positive for neck pain. Negative for facial swelling, neck stiffness and ear discharge.   Eyes: Negative for photophobia and visual disturbance.  Respiratory: Negative for apnea, shortness of breath and wheezing.     Cardiovascular: Positive for syncope. Negative for chest pain.  Gastrointestinal: Negative for abdominal pain.  Genitourinary: Negative for dysuria and difficulty urinating.  Skin: Negative for color change.  Neurological: Negative for dizziness, facial asymmetry, weakness, numbness and headaches.  Hematological: Negative for adenopathy.  Psychiatric/Behavioral: Negative for agitation.    Physical Exam  BP 120/79  Pulse 72  Temp(Src) 97.9 F (36.6 C) (Oral)  Resp 18  SpO2 97%  Physical Exam  Constitutional: She is oriented to person, place, and time. She appears well-developed and well-nourished.  HENT:  Head: Normocephalic and atraumatic.  Eyes: EOM are normal. Pupils are equal, round, and reactive to light. Left eye exhibits no discharge.  Neck: Normal range of motion.  Cardiovascular: Normal rate, regular rhythm and normal heart sounds.   Pulmonary/Chest: Effort normal and breath sounds normal. No respiratory distress.  Abdominal: Soft. Bowel sounds are normal. She exhibits no distension. There is no tenderness. There is no rebound.  Musculoskeletal: Normal range of motion. She exhibits no edema and no tenderness.  Neurological: She is alert and oriented to person, place, and time. She has normal reflexes. She displays normal reflexes. No cranial nerve deficit. Coordination normal.  Skin: Skin is warm and dry. She is not diaphoretic.  Psychiatric: She has a normal mood and affect.    ED Course  Procedures  MDM  Date: 11/22/2010   Rate:68  Rhythm: normal sinus rhythm  QRS Axis:  normal  Intervals: PR prolonged  ST/T Wave abnormalities: normal  Conduction Disutrbances:first-degree A-V block   Narrative Interpretation:   Old EKG Reviewed: changes noted       Nashon Erbes K Rumaldo Difatta-Rasch, MD 12/12/10 1416

## 2010-12-12 NOTE — ED Notes (Signed)
Pt to room 5 by ems via stretcher.  Pt is a/a/ox4, moe + x 4 ext, reports syncope while in church just pta. Per ems pt had + orthostats on scene, pt denies any c/o at this time.

## 2010-12-12 NOTE — ED Notes (Signed)
Pt. Was escorted to Restroom

## 2010-12-28 ENCOUNTER — Other Ambulatory Visit: Payer: Self-pay | Admitting: Internal Medicine

## 2010-12-28 DIAGNOSIS — E041 Nontoxic single thyroid nodule: Secondary | ICD-10-CM

## 2011-03-21 ENCOUNTER — Other Ambulatory Visit: Payer: Medicare Other

## 2011-08-09 ENCOUNTER — Other Ambulatory Visit (HOSPITAL_COMMUNITY): Payer: Self-pay | Admitting: Internal Medicine

## 2011-08-09 DIAGNOSIS — Z1231 Encounter for screening mammogram for malignant neoplasm of breast: Secondary | ICD-10-CM

## 2011-08-31 ENCOUNTER — Ambulatory Visit (HOSPITAL_COMMUNITY)
Admission: RE | Admit: 2011-08-31 | Discharge: 2011-08-31 | Disposition: A | Discharge status: Home / Self Care | Payer: Medicare Other | Source: Ambulatory Visit | Attending: Internal Medicine | Admitting: Internal Medicine

## 2011-08-31 DIAGNOSIS — Z1231 Encounter for screening mammogram for malignant neoplasm of breast: Secondary | ICD-10-CM

## 2012-08-29 ENCOUNTER — Other Ambulatory Visit (HOSPITAL_COMMUNITY): Payer: Self-pay | Admitting: Internal Medicine

## 2012-08-29 DIAGNOSIS — Z1231 Encounter for screening mammogram for malignant neoplasm of breast: Secondary | ICD-10-CM

## 2012-09-04 ENCOUNTER — Ambulatory Visit (HOSPITAL_COMMUNITY): Payer: Medicare Other

## 2012-09-13 ENCOUNTER — Ambulatory Visit (HOSPITAL_COMMUNITY)
Admission: RE | Admit: 2012-09-13 | Discharge: 2012-09-13 | Disposition: A | Discharge status: Home / Self Care | Payer: Medicare Other | Source: Ambulatory Visit | Attending: Internal Medicine | Admitting: Internal Medicine

## 2012-09-13 DIAGNOSIS — Z1231 Encounter for screening mammogram for malignant neoplasm of breast: Secondary | ICD-10-CM | POA: Insufficient documentation

## 2013-09-09 ENCOUNTER — Other Ambulatory Visit (HOSPITAL_COMMUNITY): Payer: Self-pay | Admitting: Internal Medicine

## 2013-09-09 DIAGNOSIS — Z1231 Encounter for screening mammogram for malignant neoplasm of breast: Secondary | ICD-10-CM

## 2013-09-19 ENCOUNTER — Ambulatory Visit (HOSPITAL_COMMUNITY)
Admission: RE | Admit: 2013-09-19 | Discharge: 2013-09-19 | Disposition: A | Discharge status: Home / Self Care | Payer: Medicare Other | Source: Ambulatory Visit | Attending: Internal Medicine | Admitting: Internal Medicine

## 2013-09-19 DIAGNOSIS — Z1231 Encounter for screening mammogram for malignant neoplasm of breast: Secondary | ICD-10-CM

## 2014-09-29 ENCOUNTER — Other Ambulatory Visit (HOSPITAL_COMMUNITY): Payer: Self-pay | Admitting: Internal Medicine

## 2014-09-29 DIAGNOSIS — Z1231 Encounter for screening mammogram for malignant neoplasm of breast: Secondary | ICD-10-CM

## 2014-10-08 ENCOUNTER — Ambulatory Visit (HOSPITAL_COMMUNITY)
Admission: RE | Admit: 2014-10-08 | Discharge: 2014-10-08 | Disposition: A | Discharge status: Home / Self Care | Payer: Medicare Other | Source: Ambulatory Visit | Attending: Internal Medicine | Admitting: Internal Medicine

## 2014-10-08 DIAGNOSIS — Z1231 Encounter for screening mammogram for malignant neoplasm of breast: Secondary | ICD-10-CM | POA: Diagnosis not present

## 2018-02-02 ENCOUNTER — Other Ambulatory Visit: Payer: Self-pay

## 2018-02-02 ENCOUNTER — Inpatient Hospital Stay (HOSPITAL_COMMUNITY): Payer: Medicare Other

## 2018-02-02 ENCOUNTER — Inpatient Hospital Stay (HOSPITAL_COMMUNITY)
Admission: EM | Admit: 2018-02-02 | Discharge: 2018-02-06 | DRG: 062 | Disposition: A | Discharge status: Rehabilitation Facility | Payer: Medicare Other | Attending: Neurology | Admitting: Neurology

## 2018-02-02 ENCOUNTER — Emergency Department (HOSPITAL_COMMUNITY): Payer: Medicare Other

## 2018-02-02 ENCOUNTER — Encounter (HOSPITAL_COMMUNITY): Payer: Self-pay

## 2018-02-02 DIAGNOSIS — Z7984 Long term (current) use of oral hypoglycemic drugs: Secondary | ICD-10-CM | POA: Diagnosis not present

## 2018-02-02 DIAGNOSIS — G8191 Hemiplegia, unspecified affecting right dominant side: Secondary | ICD-10-CM | POA: Diagnosis present

## 2018-02-02 DIAGNOSIS — Z79899 Other long term (current) drug therapy: Secondary | ICD-10-CM

## 2018-02-02 DIAGNOSIS — E079 Disorder of thyroid, unspecified: Secondary | ICD-10-CM | POA: Diagnosis present

## 2018-02-02 DIAGNOSIS — Z833 Family history of diabetes mellitus: Secondary | ICD-10-CM | POA: Diagnosis not present

## 2018-02-02 DIAGNOSIS — Z9849 Cataract extraction status, unspecified eye: Secondary | ICD-10-CM | POA: Diagnosis not present

## 2018-02-02 DIAGNOSIS — Z8673 Personal history of transient ischemic attack (TIA), and cerebral infarction without residual deficits: Secondary | ICD-10-CM | POA: Diagnosis not present

## 2018-02-02 DIAGNOSIS — I10 Essential (primary) hypertension: Secondary | ICD-10-CM | POA: Diagnosis not present

## 2018-02-02 DIAGNOSIS — D509 Iron deficiency anemia, unspecified: Secondary | ICD-10-CM | POA: Diagnosis present

## 2018-02-02 DIAGNOSIS — E876 Hypokalemia: Secondary | ICD-10-CM | POA: Diagnosis present

## 2018-02-02 DIAGNOSIS — I639 Cerebral infarction, unspecified: Principal | ICD-10-CM | POA: Diagnosis present

## 2018-02-02 DIAGNOSIS — R262 Difficulty in walking, not elsewhere classified: Secondary | ICD-10-CM | POA: Diagnosis present

## 2018-02-02 DIAGNOSIS — D72828 Other elevated white blood cell count: Secondary | ICD-10-CM | POA: Diagnosis not present

## 2018-02-02 DIAGNOSIS — E785 Hyperlipidemia, unspecified: Secondary | ICD-10-CM | POA: Diagnosis present

## 2018-02-02 DIAGNOSIS — R29705 NIHSS score 5: Secondary | ICD-10-CM | POA: Diagnosis present

## 2018-02-02 DIAGNOSIS — G8929 Other chronic pain: Secondary | ICD-10-CM | POA: Diagnosis present

## 2018-02-02 DIAGNOSIS — F3289 Other specified depressive episodes: Secondary | ICD-10-CM

## 2018-02-02 DIAGNOSIS — E0822 Diabetes mellitus due to underlying condition with diabetic chronic kidney disease: Secondary | ICD-10-CM

## 2018-02-02 DIAGNOSIS — M545 Low back pain: Secondary | ICD-10-CM | POA: Diagnosis present

## 2018-02-02 DIAGNOSIS — E1122 Type 2 diabetes mellitus with diabetic chronic kidney disease: Secondary | ICD-10-CM | POA: Diagnosis present

## 2018-02-02 DIAGNOSIS — Z7982 Long term (current) use of aspirin: Secondary | ICD-10-CM

## 2018-02-02 DIAGNOSIS — Z961 Presence of intraocular lens: Secondary | ICD-10-CM | POA: Diagnosis present

## 2018-02-02 DIAGNOSIS — E119 Type 2 diabetes mellitus without complications: Secondary | ICD-10-CM | POA: Diagnosis not present

## 2018-02-02 DIAGNOSIS — B9620 Unspecified Escherichia coli [E. coli] as the cause of diseases classified elsewhere: Secondary | ICD-10-CM | POA: Diagnosis not present

## 2018-02-02 DIAGNOSIS — Z23 Encounter for immunization: Secondary | ICD-10-CM | POA: Diagnosis not present

## 2018-02-02 DIAGNOSIS — E08638 Diabetes mellitus due to underlying condition with other oral complications: Secondary | ICD-10-CM

## 2018-02-02 DIAGNOSIS — I129 Hypertensive chronic kidney disease with stage 1 through stage 4 chronic kidney disease, or unspecified chronic kidney disease: Secondary | ICD-10-CM | POA: Diagnosis present

## 2018-02-02 DIAGNOSIS — N179 Acute kidney failure, unspecified: Secondary | ICD-10-CM | POA: Diagnosis present

## 2018-02-02 DIAGNOSIS — N184 Chronic kidney disease, stage 4 (severe): Secondary | ICD-10-CM | POA: Diagnosis present

## 2018-02-02 DIAGNOSIS — N390 Urinary tract infection, site not specified: Secondary | ICD-10-CM | POA: Diagnosis not present

## 2018-02-02 DIAGNOSIS — Z90710 Acquired absence of both cervix and uterus: Secondary | ICD-10-CM | POA: Diagnosis not present

## 2018-02-02 DIAGNOSIS — R29706 NIHSS score 6: Secondary | ICD-10-CM | POA: Diagnosis present

## 2018-02-02 DIAGNOSIS — D631 Anemia in chronic kidney disease: Secondary | ICD-10-CM | POA: Diagnosis present

## 2018-02-02 DIAGNOSIS — Z96641 Presence of right artificial hip joint: Secondary | ICD-10-CM | POA: Diagnosis present

## 2018-02-02 DIAGNOSIS — E1165 Type 2 diabetes mellitus with hyperglycemia: Secondary | ICD-10-CM | POA: Diagnosis present

## 2018-02-02 DIAGNOSIS — I120 Hypertensive chronic kidney disease with stage 5 chronic kidney disease or end stage renal disease: Secondary | ICD-10-CM | POA: Diagnosis present

## 2018-02-02 DIAGNOSIS — I5030 Unspecified diastolic (congestive) heart failure: Secondary | ICD-10-CM | POA: Diagnosis not present

## 2018-02-02 DIAGNOSIS — I69351 Hemiplegia and hemiparesis following cerebral infarction affecting right dominant side: Secondary | ICD-10-CM | POA: Diagnosis present

## 2018-02-02 DIAGNOSIS — Z789 Other specified health status: Secondary | ICD-10-CM

## 2018-02-02 DIAGNOSIS — Z809 Family history of malignant neoplasm, unspecified: Secondary | ICD-10-CM | POA: Diagnosis not present

## 2018-02-02 DIAGNOSIS — R739 Hyperglycemia, unspecified: Secondary | ICD-10-CM

## 2018-02-02 HISTORY — DX: Iron deficiency anemia, unspecified: D50.9

## 2018-02-02 HISTORY — DX: Low back pain, unspecified: M54.50

## 2018-02-02 HISTORY — DX: Chronic kidney disease, unspecified: N18.9

## 2018-02-02 HISTORY — DX: Low back pain: M54.5

## 2018-02-02 LAB — BASIC METABOLIC PANEL
Anion gap: 11 (ref 5–15)
BUN: 52 mg/dL — AB (ref 8–23)
CALCIUM: 8.5 mg/dL — AB (ref 8.9–10.3)
CO2: 21 mmol/L — AB (ref 22–32)
CREATININE: 1.7 mg/dL — AB (ref 0.44–1.00)
Chloride: 102 mmol/L (ref 98–111)
GFR calc non Af Amer: 27 mL/min — ABNORMAL LOW (ref >60)
GFR, EST AFRICAN AMERICAN: 31 mL/min — AB (ref >60)
GLUCOSE: 141 mg/dL — AB (ref 70–99)
Potassium: 3.7 mmol/L (ref 3.5–5.1)
Sodium: 134 mmol/L — ABNORMAL LOW (ref 135–145)

## 2018-02-02 LAB — I-STAT CHEM 8, ED
BUN: 46 mg/dL — ABNORMAL HIGH (ref 8–23)
CALCIUM ION: 1.1 mmol/L — AB (ref 1.15–1.40)
CHLORIDE: 101 mmol/L (ref 98–111)
Creatinine, Ser: 1.8 mg/dL — ABNORMAL HIGH (ref 0.44–1.00)
Glucose, Bld: 138 mg/dL — ABNORMAL HIGH (ref 70–99)
HEMATOCRIT: 33 % — AB (ref 36.0–46.0)
Hemoglobin: 11.2 g/dL — ABNORMAL LOW (ref 12.0–15.0)
Potassium: 3.4 mmol/L — ABNORMAL LOW (ref 3.5–5.1)
SODIUM: 135 mmol/L (ref 135–145)
TCO2: 23 mmol/L (ref 22–32)

## 2018-02-02 LAB — DIFFERENTIAL
Abs Immature Granulocytes: 0 10*3/uL (ref 0.0–0.1)
Basophils Absolute: 0.1 10*3/uL (ref 0.0–0.1)
Basophils Relative: 1 %
EOS ABS: 0.2 10*3/uL (ref 0.0–0.7)
Eosinophils Relative: 2 %
IMMATURE GRANULOCYTES: 0 %
LYMPHS ABS: 1.5 10*3/uL (ref 0.7–4.0)
Lymphocytes Relative: 17 %
MONOS PCT: 5 %
Monocytes Absolute: 0.5 10*3/uL (ref 0.1–1.0)
NEUTROS ABS: 7 10*3/uL (ref 1.7–7.7)
Neutrophils Relative %: 75 %

## 2018-02-02 LAB — CBC
HCT: 34.7 % — ABNORMAL LOW (ref 36.0–46.0)
Hemoglobin: 11.8 g/dL — ABNORMAL LOW (ref 12.0–15.0)
MCH: 31.1 pg (ref 26.0–34.0)
MCHC: 34 g/dL (ref 30.0–36.0)
MCV: 91.3 fL (ref 78.0–100.0)
PLATELETS: 287 10*3/uL (ref 150–400)
RBC: 3.8 MIL/uL — AB (ref 3.87–5.11)
RDW: 12.3 % (ref 11.5–15.5)
WBC: 9.4 10*3/uL (ref 4.0–10.5)

## 2018-02-02 LAB — I-STAT TROPONIN, ED: Troponin i, poc: 0.02 ng/mL (ref 0.00–0.08)

## 2018-02-02 LAB — PROTIME-INR
INR: 1.04
Prothrombin Time: 13.5 seconds (ref 11.4–15.2)

## 2018-02-02 LAB — APTT: APTT: 28 s (ref 24–36)

## 2018-02-02 LAB — CBG MONITORING, ED: GLUCOSE-CAPILLARY: 149 mg/dL — AB (ref 70–99)

## 2018-02-02 MED ORDER — STROKE: EARLY STAGES OF RECOVERY BOOK
Freq: Once | Status: AC
  Administered 2018-02-02: 23:00:00
  Filled 2018-02-02 (×2): qty 1

## 2018-02-02 MED ORDER — SENNOSIDES-DOCUSATE SODIUM 8.6-50 MG PO TABS: 1.0000 | ORAL_TABLET | Freq: Every evening | ORAL | Status: DC | PRN

## 2018-02-02 MED ORDER — ALTEPLASE (STROKE) FULL DOSE INFUSION
0.9000 mg/kg | Freq: Once | INTRAVENOUS | Status: AC
  Administered 2018-02-02: 60.2 mg via INTRAVENOUS
  Filled 2018-02-02: qty 100

## 2018-02-02 MED ORDER — ACETAMINOPHEN 160 MG/5ML PO SOLN: 650.0000 mg | ORAL | Status: DC | PRN

## 2018-02-02 MED ORDER — SODIUM CHLORIDE 0.9 % IV SOLN
INTRAVENOUS | Status: DC
  Administered 2018-02-02 – 2018-02-05 (×5): via INTRAVENOUS

## 2018-02-02 MED ORDER — IOPAMIDOL (ISOVUE-370) INJECTION 76%
INTRAVENOUS | Status: AC
  Filled 2018-02-02: qty 50

## 2018-02-02 MED ORDER — CLEVIDIPINE BUTYRATE 0.5 MG/ML IV EMUL
INTRAVENOUS | Status: AC
  Filled 2018-02-02: qty 50

## 2018-02-02 MED ORDER — ACETAMINOPHEN 650 MG RE SUPP: 650.0000 mg | RECTAL | Status: DC | PRN

## 2018-02-02 MED ORDER — CLEVIDIPINE BUTYRATE 0.5 MG/ML IV EMUL
0.0000 mg/h | INTRAVENOUS | Status: DC
  Administered 2018-02-02: 2 mg/h via INTRAVENOUS
  Administered 2018-02-03: 1 mg/h via INTRAVENOUS
  Administered 2018-02-04: 3 mg/h via INTRAVENOUS
  Filled 2018-02-02: qty 50
  Filled 2018-02-02: qty 100

## 2018-02-02 MED ORDER — IOPAMIDOL (ISOVUE-370) INJECTION 76%
50.0000 mL | Freq: Once | INTRAVENOUS | Status: AC | PRN
  Administered 2018-02-02: 50 mL via INTRAVENOUS

## 2018-02-02 MED ORDER — ACETAMINOPHEN 325 MG PO TABS
650.0000 mg | ORAL_TABLET | ORAL | Status: DC | PRN
  Administered 2018-02-02 – 2018-02-06 (×6): 650 mg via ORAL
  Filled 2018-02-02 (×6): qty 2

## 2018-02-02 NOTE — ED Notes (Signed)
ED Provider at bedside. 

## 2018-02-02 NOTE — Progress Notes (Signed)
Pharmacist Code Stroke Response  Notified to mix tPA at 2059 by Dr. Wilford CornerArora Delivered tPA to RN at 2102  Issues/delays encountered (if applicable): Very slight delay while Neurologist getting 2nd opinion on scan.  Babs BertinHaley Abbigayle Toole, PharmD, BCPS Please check AMION for all [facility] contact numbers Clinical Pharmacist 02/02/2018 9:11 PM

## 2018-02-02 NOTE — ED Triage Notes (Addendum)
Per [facility], pt arrives from home where she had unwitnessed syncopal episode at home. Pt believes she hit her right face because she is having 3/10 right sided facial pain. EMS denies blood thinner use by patient. EMS reports pt now complaining of right arm and right leg numbness. Initial BP of 220/115. Pt reports it occurred at 1800.

## 2018-02-02 NOTE — H&P (Addendum)
Stroke history and physical  CC: Right-sided weakness  History is obtained from: Patient, family bedside  HPI: Brandy Barnett is a 84 y.o. female past medical history of diabetes, hypertension, stroke in the past with no residual weakness, thyroid disease, last seen normal at 6 PM on 02/02/2018, when she had a sudden onset of transient loss of consciousness followed by weakness of the right side of her body. She was feeding her dogs at the time and all of a sudden lost consciousness and the next thing she remembers that she was on the floor.  Upon try to get up she could not move her right arm and her leg also felt weak on the right side.  No left-sided symptoms. EMS was called, she is brought into the emergency room and evaluated by the ER. After ER evaluation, a code stroke was activated for possible acute stroke. Patient was examined in the CT scanner as she was getting a noncontrast head CT. She denied any tingling or numbness but reported weakness only. She denied any headaches visual symptoms or neck pain. She denies any chest pain palpitations shortness of breath nausea vomiting at this time.  LKW: 6 PM on 02/02/2018 tpa given?:  Yes Premorbid modified Rankin scale (mRS): 2  ROS: ROS was performed and is negative except as noted in the HPI.    Past Medical History:  Diagnosis Date  . Diabetes mellitus   . Hypertension   . Stroke (HCC)   . Thyroid disease    \No family history on file.   Social History:   reports that she does not drink alcohol. Her tobacco and drug histories are not on file.   Medications  Current Facility-Administered Medications:  .   stroke: mapping our early stages of recovery book, , Does not apply, Once, Milon Dikes, MD .  0.9 %  sodium chloride infusion, , Intravenous, Continuous, Milon Dikes, MD .  acetaminophen (TYLENOL) tablet 650 mg, 650 mg, Oral, Q4H PRN **OR** acetaminophen (TYLENOL) solution 650 mg, 650 mg, Per Tube, Q4H PRN **OR**  acetaminophen (TYLENOL) suppository 650 mg, 650 mg, Rectal, Q4H PRN, Milon Dikes, MD .  alteplase (ACTIVASE) 1 mg/mL infusion 60.2 mg, 0.9 mg/kg, Intravenous, Once, Milon Dikes, MD .  clevidipine (CLEVIPREX) 0.5 MG/ML infusion, , , ,  .  clevidipine (CLEVIPREX) infusion 0.5 mg/mL, 0-21 mg/hr, Intravenous, Continuous, Milon Dikes, MD .  iopamidol (ISOVUE-370) 76 % injection, , , ,  .  senna-docusate (Senokot-S) tablet 1 tablet, 1 tablet, Oral, QHS PRN, Milon Dikes, MD  Current Outpatient Medications:  .  amLODipine (NORVASC) 5 MG tablet, Take 5 mg by mouth daily.  , Disp: , Rfl:  .  aspirin 81 MG EC tablet, Take 81 mg by mouth daily.  , Disp: , Rfl:  .  calcium carbonate (OS-CAL) 600 MG TABS, Take 600 mg by mouth 2 (two) times daily with a meal.  , Disp: , Rfl:  .  cyanocobalamin 500 MCG tablet, Take 500 mcg by mouth daily.  , Disp: , Rfl:  .  cyclobenzaprine (FLEXERIL) 10 MG tablet, Take 5 mg by mouth 3 (three) times daily as needed.  , Disp: , Rfl:  .  DULoxetine (CYMBALTA) 60 MG capsule, Take 60 mg by mouth daily.  , Disp: , Rfl:  .  fish oil-omega-3 fatty acids 1000 MG capsule, Take 2 g by mouth daily.  , Disp: , Rfl:  .  glipiZIDE (GLUCOTROL) 5 MG tablet, Take 5 mg by mouth 2 (two) times  daily before a meal.  , Disp: , Rfl:  .  loratadine (CLARITIN) 10 MG tablet, Take 10 mg by mouth daily.  , Disp: , Rfl:  .  metFORMIN (GLUCOPHAGE) 500 MG tablet, Take 500 mg by mouth 2 (two) times daily with a meal.  , Disp: , Rfl:  .  omeprazole (PRILOSEC) 20 MG capsule, Take 20 mg by mouth daily.  , Disp: , Rfl:  .  ramipril (ALTACE) 10 MG tablet, Take 10 mg by mouth daily.  , Disp: , Rfl:  .  rosuvastatin (CRESTOR) 40 MG tablet, Take 40 mg by mouth daily.  , Disp: , Rfl:  .  triamterene-hydrochlorothiazide (DYAZIDE) 37.5-25 MG per capsule, Take 1 capsule by mouth every morning.  , Disp: , Rfl:  Exam: Current vital signs: BP (!) 195/97 (BP Location: Right Arm)   Pulse 82   Temp (!) 96.8 F (36  C) (Oral)   Resp 11   Ht 5\' 2"  (1.575 m)   Wt 66.9 kg   SpO2 99%   BMI 26.98 kg/m  Vital signs in last 24 hours: Temp:  [96.8 F (36 C)] 96.8 F (36 C) (08/30 2034) Pulse Rate:  [82] 82 (08/30 2034) Resp:  [11] 11 (08/30 2034) BP: (195)/(97) 195/97 (08/30 2034) SpO2:  [98 %-99 %] 99 % (08/30 2034) Weight:  [63.5 kg-66.9 kg] 66.9 kg (08/30 2100) GENERAL: Awake, alert in NAD HEENT: - Normocephalic and atraumatic, dry mm, no LN++, no Thyromegally LUNGS - Clear to auscultation bilaterally with no wheezes CV - S1S2 RRR, no m/r/g, equal pulses bilaterally. ABDOMEN - Soft, nontender, nondistended with normoactive BS Ext: warm, well perfused, intact peripheral pulses, trace pedal edema bilaterally  NEURO:  Mental Status: AA&Ox3  Language: speech is mildly dysarthric.  Naming, repetition, fluency, and comprehension intact. Cranial Nerves: PERRL. EOMI, visual fields full, mild flattening of the right nasolabial fold, facial sensation intact, hearing intact, tongue/uvula/soft palate midline, normal sternocleidomastoid and trapezius muscle strength. No evidence of tongue atrophy or fibrillations Motor: 2/5 right upper extremity, 4-/5 right lower extremity, 5/5 left upper and left lower extremity. Tone: is normal and bulk is normal Sensation- Intact to light touch bilaterally, no extinction Coordination: FTN intact bilaterally Gait- deferred  NIHSS 1a Level of Conscious.: 0 1b LOC Questions: 0 1c LOC Commands: 0 2 Best Gaze: 0 3 Visual: 0 4 Facial Palsy: 1 5a Motor Arm - left: 0 5b Motor Arm - Right: 3 6a Motor Leg - Left: 0 6b Motor Leg - Right: 1 7 Limb Ataxia: 0 8 Sensory: 0 9 Best Language: 0 10 Dysarthria: 1 11 Extinct. and Inatten.: 0 TOTAL: 6 Labs I have reviewed labs in epic and the results pertinent to this consultation are: Labs pending at the time of dictation  Imaging I have reviewed the images obtained:  CT-scan of the brain-no acute bleed.  Areas of  hypodensity at the midbrain slightly increased compared to 12/12/2010 likely due to mineralization.  No evidence of acute bleed.  No evidence of bulging hypodensity.  Aspects 10.  CTA head and neck-  Assessment:  84 year old woman with past medical history of diabetes hypertension and prior stroke with no residual deficits, presenting with acute onset of right-sided hemiparesis after a syncopal episode. Weakness persisted after the episode.  No seizure-like activity noted. Symptoms consistent with an acute ischemic stroke, likely small vessel etiology-pure motor lacunar kind. No contraindications for TPA. TPA initiated at 2106 hrs. Patient will be admitted to the ICU for further work-up. No  evidence of large vessel occlusion on CTA head and neck.  Not a candidate for endovascular thrombectomy.  Impression: Acute ischemic stroke-likely small vessel etiology Hypertension Diabetes Thyroid disease  Plan: Admit to neuro ICU Hold aspirin and anticoagulation for 24 hours post TPA until neuroimaging is stable without evidence of bleed at the time. Blood pressure control-goal systolic less than 180, diastolic less than 100. MRI brain without contrast 2D echo Hemoglobin A1c Lipid panel Hyperglycemia management per sliding scale insulin to maintain glucose 1 40-1 80 Physical therapy Outpatient therapy Speech therapy Follow-up on the CT of the C-spine as she had a unwitnessed fall to rule out fracture or trauma.  This is especially important as she is post TPA and we want to make sure that there is no evidence of bleed or injury to the C-spine.  CNS -Close neuro monitoring  Dysarthria -NPO until cleared by speech -ST  Hemiplegia and hemiparesis following cerebral infarction affecting right dominant side -PT/OT -PM&R consult  RESP Breathing normally and saturating well at room air. Continue to monitor clinically and maintain saturations above 95%. Plan a chest x-ray to rule out  aspiration due to dysarthria from the stroke.  CV Essential (primary) hypertension -Aggressive BP control, goal less than 180/100. -Currently on Cleviprex drip to maintain blood pressures Titrate Cleviprex drip as needed. -TTE -Continue BB  Hyperlipidemia, unspecified  - Statin for goal LDL < 70  HEME Iron Deficiency Anemia -Monitor -transfuse for hgb < 7  ENDO Type 2 diabetes mellitus w/o complications. Type 2 diabetes mellitus with hyperglycemia  -SSI -Start oral meds -goal HgbA1c < 7  GI/GU CKD Stage 4 (GFR 15-29) -Gentle hydration -avoid nephrotoxic agents-did get IV contrast for CTA.  Monitor labs and clinically. -She might need renal consult in the morning   Fluid/Electrolyte Disorders -Repeat labs -Replete as necessary  ID Possible Aspiration PNA -CXR -NPO -Monitor  Prophylaxis DVT: SCDs GI: Not indicated Bowel: Docusate/senna  Diet: NPO until cleared by speech  Code Status: Full Code    Delays in process: Code stroke not called by EMS, patient in ER for about 15 minutes or so prior to code stroke activation.  -- Milon Dikes, MD [facility] Pager: [phone] If 7pm to 7am, please call on call as listed on AMION.  CRITICAL CARE ATTESTATION This patient is critically ill and at significant risk of neurological worsening, death and care requires constant monitoring of vital signs, hemodynamics,respiratory and cardiac monitoring. I spent 55 minutes of neurocritical care time performing neurological assessment, discussion with family, other specialists and medical decision making of high complexityin the care of  this patient.

## 2018-02-02 NOTE — Code Documentation (Addendum)
Code Stroke Note: 84 yo female to [facility] via [facility] after syncopal episode at home.  Code stroke initiated in ED. LSW 1800.  Pt with right sided facial droop, R arm and leg weakness. NIHSS 6. CT/CTA head done.  TPA initiated at 2106.  Cleviprex used for BP control.  POC Admit to 4N ICU.

## 2018-02-02 NOTE — ED Provider Notes (Signed)
[facility] Emergency Department Provider Note MRN:  [id number]  Arrival date & time: 02/02/18     Chief Complaint   Loss of Consciousness and Code Stroke   History of Present Illness   Brandy Barnett is a 84 y.o. year-old female with a history of diabetes, hypertension, stroke presenting to the ED with chief complaint of syncope, weakness.  Patient was outside with her dogs, let them use the bathroom before bedtime.  She was at her baseline function, feeling her normal self.  She recalls turning around to head back into the house, and then the next thing she remembers she is on the ground.  Unwitnessed syncope, no warning, no preceding dizziness or chest pain.  Since the syncopal episode, patient is endorsing right arm and right leg weakness, only mild right shoulder pain, no leg pain, no nausea or vomiting, no chest pain or shortness of breath, no abdominal pain, no neck pain.  Denies blood thinners.  Last known normal 6 PM.  Review of Systems  A complete 10 system review of systems was obtained and all systems are negative except as noted in the HPI and PMH.   Patient's Health History    Past Medical History:  Diagnosis Date  . Diabetes mellitus   . Hypertension   . Stroke (HCC)   . Thyroid disease     Past Surgical History:  Procedure Laterality Date  . JOINT REPLACEMENT      No family history on file.  Social History   Socioeconomic History  . Marital status: Widowed    Spouse name: Not on file  . Number of children: Not on file  . Years of education: Not on file  . Highest education level: Not on file  Occupational History  . Not on file  Social Needs  . Financial resource strain: Not on file  . Food insecurity:    Worry: Not on file    Inability: Not on file  . Transportation needs:    Medical: Not on file    Non-medical: Not on file  Tobacco Use  . Smoking status: Not on file  Substance and Sexual Activity  . Alcohol use: No  . Drug use: Not  on file  . Sexual activity: Not on file  Lifestyle  . Physical activity:    Days per week: Not on file    Minutes per session: Not on file  . Stress: Not on file  Relationships  . Social connections:    Talks on phone: Not on file    Gets together: Not on file    Attends religious service: Not on file    Active member of club or organization: Not on file    Attends meetings of clubs or organizations: Not on file    Relationship status: Not on file  . Intimate partner violence:    Fear of current or ex partner: Not on file    Emotionally abused: Not on file    Physically abused: Not on file    Forced sexual activity: Not on file  Other Topics Concern  . Not on file  Social History Narrative  . Not on file     Physical Exam  Vital Signs and Nursing Notes reviewed Vitals:   02/02/18 2126 02/02/18 2137  BP: (!) 197/85 (!) 147/78  Pulse:  85  Resp:  13  Temp:    SpO2:  97%    CONSTITUTIONAL: Well-appearing, NAD NEURO:  Alert and oriented x 3,  moderate right arm and right leg weakness, no slurred speech, no appreciable right facial weakness EYES:  eyes equal and reactive ENT/NECK:  no LAD, no JVD CARDIO: Regular rate, well-perfused, normal S1 and S2 PULM:  CTAB no wheezing or rhonchi GI/GU:  normal bowel sounds, non-distended, non-tender MSK/SPINE:  No gross deformities, no edema SKIN:  no rash, atraumatic PSYCH:  Appropriate speech and behavior  Diagnostic and Interventional Summary    EKG Interpretation  Date/Time:    Ventricular Rate:    PR Interval:    QRS Duration:   QT Interval:    QTC Calculation:   R Axis:     Text Interpretation:        Labs Reviewed  CBG MONITORING, ED - Abnormal; Notable for the following components:      Result Value   Glucose-Capillary 149 (*)    All other components within normal limits  BASIC METABOLIC PANEL  CBC  URINALYSIS, ROUTINE W REFLEX MICROSCOPIC  PROTIME-INR  APTT  DIFFERENTIAL  HEMOGLOBIN A1C  LIPID PANEL    I-STAT TROPONIN, ED  CBG MONITORING, ED  I-STAT CHEM 8, ED    CT HEAD CODE STROKE WO CONTRAST  Final Result    CT ANGIO HEAD W OR WO CONTRAST    (Results Pending)  CT ANGIO NECK W OR WO CONTRAST    (Results Pending)  CT C-SPINE NO CHARGE    (Results Pending)  DG Chest 2 View    (Results Pending)  MR BRAIN WO CONTRAST    (Results Pending)    Medications  iopamidol (ISOVUE-370) 76 % injection (has no administration in time range)  clevidipine (CLEVIPREX) 0.5 MG/ML infusion (has no administration in time range)  alteplase (ACTIVASE) 1 mg/mL infusion 60.2 mg (60.2 mg Intravenous New Bag/Given 02/02/18 2106)  clevidipine (CLEVIPREX) infusion 0.5 mg/mL (4 mg/hr Intravenous Rate/Dose Change 02/02/18 2127)   stroke: mapping our early stages of recovery book (has no administration in time range)  0.9 %  sodium chloride infusion (has no administration in time range)  acetaminophen (TYLENOL) tablet 650 mg (has no administration in time range)    Or  acetaminophen (TYLENOL) solution 650 mg (has no administration in time range)    Or  acetaminophen (TYLENOL) suppository 650 mg (has no administration in time range)  senna-docusate (Senokot-S) tablet 1 tablet (has no administration in time range)  iopamidol (ISOVUE-370) 76 % injection 50 mL (50 mLs Intravenous Contrast Given 02/02/18 2119)     Procedures Critical Care Critical Care Documentation Critical care time provided by me (excluding procedures): 33 minutes  Condition necessitating critical care: Acute ischemic stroke, right-sided weakness  Components of critical care management: reviewing of prior records, laboratory and imaging interpretation, frequent re-examination and reassessment of vital signs, administration of IV TPA, discussion with consulting services.    ED Course and Medical Decision Making  I have reviewed the triage vital signs and the nursing notes.  Pertinent labs & imaging results that were available during my care  of the patient were reviewed by me and considered in my medical decision making (see below for details).    Unwitnessed and unprovoked syncopal episode in this 84 year old female, waking up with right-sided deficits.  Last known normal 2 and half hours prior to my evaluation.  Code stroke initiated, acute ischemic stroke identified on CT imaging per neurology.  TPA initiated per neurology recommendations, patient admitted to neuro ICU.  Elmer SowMichael M. Pilar PlateBero, MD [facility] [email or web address]  Final Clinical  Impressions(s) / ED Diagnoses     ICD-10-CM   1. 1 minute Apgar score 1 Z78.9 CT C-SPINE NO CHARGE    CT C-SPINE NO CHARGE  2. Acute ischemic stroke [facility]) I63.9 DG Chest 2 View    DG Chest 2 View    ED Discharge Orders    None         Sabas Sous, MD 02/02/18 2141

## 2018-02-03 ENCOUNTER — Inpatient Hospital Stay (HOSPITAL_COMMUNITY): Payer: Medicare Other

## 2018-02-03 DIAGNOSIS — I5030 Unspecified diastolic (congestive) heart failure: Secondary | ICD-10-CM

## 2018-02-03 LAB — LIPID PANEL
CHOL/HDL RATIO: 2.2 ratio
CHOLESTEROL: 83 mg/dL (ref 0–200)
HDL: 37 mg/dL — ABNORMAL LOW (ref >40)
LDL CALC: 34 mg/dL (ref 0–99)
Triglycerides: 58 mg/dL (ref <150)
VLDL: 12 mg/dL (ref 0–40)

## 2018-02-03 LAB — GLUCOSE, CAPILLARY
GLUCOSE-CAPILLARY: 157 mg/dL — AB (ref 70–99)
Glucose-Capillary: 112 mg/dL — ABNORMAL HIGH (ref 70–99)
Glucose-Capillary: 124 mg/dL — ABNORMAL HIGH (ref 70–99)

## 2018-02-03 LAB — MRSA PCR SCREENING: MRSA by PCR: NEGATIVE

## 2018-02-03 LAB — HEMOGLOBIN A1C
Hgb A1c MFr Bld: 6 % — ABNORMAL HIGH (ref 4.8–5.6)
Mean Plasma Glucose: 125.5 mg/dL

## 2018-02-03 MED ORDER — AMLODIPINE BESYLATE 5 MG PO TABS
5.0000 mg | ORAL_TABLET | Freq: Every day | ORAL | Status: DC
  Administered 2018-02-03: 5 mg via ORAL
  Filled 2018-02-03: qty 1

## 2018-02-03 MED ORDER — INSULIN ASPART 100 UNIT/ML ~~LOC~~ SOLN
0.0000 [IU] | Freq: Three times a day (TID) | SUBCUTANEOUS | Status: DC
  Administered 2018-02-03 – 2018-02-04 (×3): 1 [IU] via SUBCUTANEOUS
  Administered 2018-02-05: 5 [IU] via SUBCUTANEOUS
  Administered 2018-02-06: 1 [IU] via SUBCUTANEOUS

## 2018-02-03 MED ORDER — ASPIRIN 325 MG PO TABS
325.0000 mg | ORAL_TABLET | Freq: Every day | ORAL | Status: DC
  Administered 2018-02-03 – 2018-02-06 (×4): 325 mg via ORAL
  Filled 2018-02-03 (×4): qty 1

## 2018-02-03 MED ORDER — ROSUVASTATIN CALCIUM 5 MG PO TABS
10.0000 mg | ORAL_TABLET | Freq: Every day | ORAL | Status: DC
  Administered 2018-02-03 – 2018-02-05 (×3): 10 mg via ORAL
  Filled 2018-02-03: qty 2
  Filled 2018-02-03 (×2): qty 1

## 2018-02-03 MED ORDER — LABETALOL HCL 5 MG/ML IV SOLN
10.0000 mg | INTRAVENOUS | Status: DC | PRN
  Administered 2018-02-03 – 2018-02-04 (×2): 10 mg via INTRAVENOUS
  Filled 2018-02-03 (×3): qty 4

## 2018-02-03 MED ORDER — POTASSIUM CHLORIDE CRYS ER 20 MEQ PO TBCR
20.0000 meq | EXTENDED_RELEASE_TABLET | Freq: Two times a day (BID) | ORAL | Status: AC
  Administered 2018-02-03 (×2): 20 meq via ORAL
  Filled 2018-02-03 (×2): qty 1

## 2018-02-03 NOTE — Progress Notes (Signed)
  Echocardiogram 2D Echocardiogram has been performed.  Dorena Dewiffany G Bekah Igoe 02/03/2018, 6:08 PM

## 2018-02-03 NOTE — Progress Notes (Signed)
STROKE TEAM PROGRESS NOTE   HISTORY OF PRESENT ILLNESS (per record) Brandy Barnett is a 84 y.o. female past medical history of diabetes, hypertension, stroke in the past with no residual weakness, thyroid disease, last seen normal at 6 PM on 02/02/2018, when she had a sudden onset of transient loss of consciousness followed by weakness of the right side of her body. She was feeding her dogs at the time and all of a sudden lost consciousness and the next thing she remembers that she was on the floor.  Upon try to get up she could not move her right arm and her leg also felt weak on the right side.  No left-sided symptoms. EMS was called, she is brought into the emergency room and evaluated by the ER. After ER evaluation, a code stroke was activated for possible acute stroke. Patient was examined in the CT scanner as she was getting a noncontrast head CT. She denied any tingling or numbness but reported weakness only. She denied any headaches visual symptoms or neck pain. She denies any chest pain palpitations shortness of breath nausea vomiting at this time.  LKW: 6 PM on 02/02/2018 tpa given?:  Yes Premorbid modified Rankin scale (mRS): 2   SUBJECTIVE (INTERVAL HISTORY) There are no family members present.  The patient voices no complaints.  The patient's nurse was in the room.  Diet and medications were clarified.  The plan is to wean patient off Cleviprex. She was on Altace, Norvasc and Dyazide prior to admission.  We will avoid the diuretic for now secondary to renal compromise.  The patient is a diabetic and will need sliding scale insulin for now.  She passed a bedside swallowing evaluation performed by the RN.   OBJECTIVE Vitals:   02/03/18 0530 02/03/18 0600 02/03/18 0630 02/03/18 0700  BP: (!) 146/79 (!) 165/74 (!) 157/79 (!) 142/88  Pulse: 70 69 71 70  Resp: 13 11 (!) 8 16  Temp:      TempSrc:      SpO2: 94% 95% 94% 90%  Weight:      Height:        CBC:  Recent Labs  Lab  02/02/18 2129 02/02/18 2145  WBC 9.4  --   NEUTROABS 7.0  --   HGB 11.8* 11.2*  HCT 34.7* 33.0*  MCV 91.3  --   PLT 287  --     Basic Metabolic Panel:  Recent Labs  Lab 02/02/18 2129 02/02/18 2145  NA 134* 135  K 3.7 3.4*  CL 102 101  CO2 21*  --   GLUCOSE 141* 138*  BUN 52* 46*  CREATININE 1.70* 1.80*  CALCIUM 8.5*  --     Lipid Panel:     Component Value Date/Time   CHOL 83 02/03/2018 0323   TRIG 58 02/03/2018 0323   HDL 37 (L) 02/03/2018 0323   CHOLHDL 2.2 02/03/2018 0323   VLDL 12 02/03/2018 0323   LDLCALC 34 02/03/2018 0323   HgbA1c:  Lab Results  Component Value Date   HGBA1C 6.0 (H) 02/03/2018   Urine Drug Screen: No results found for: LABOPIA, COCAINSCRNUR, LABBENZ, AMPHETMU, THCU, LABBARB  Alcohol Level No results found for: ETH  IMAGING  Ct Angio Head W Or Wo Contrast Ct Angio Neck W Or Wo Contrast 02/02/2018 IMPRESSION:  1. Negative CTA for large vessel occlusion. No hemodynamically significant or correctable stenosis identified.  2. Mild atherosclerotic change about the carotid bifurcations and carotid siphons for patient age.  3.  Diffuse vascular tortuosity, suggesting chronic underlying hypertension.    Ct Head Code Stroke Wo Contrast 02/02/2018 IMPRESSION:  1. No intracranial hemorrhage. Areas of hyperdensity at the midbrain are slightly increased compared to 12/12/2010 and likely due to a degree of parenchymal mineralization.  2. ASPECTS is 10.    Ct C-spine No Charge 02/02/2018 IMPRESSION:  1. No acute fracture or static subluxation of the cervical spine.  2. Severe multilevel facet arthrosis.   Dg Chest Port 1 View 02/02/2018 IMPRESSION:  1. Cardiomegaly and chronic interstitial changes.  2. Moderate hiatal hernia.  3.  Aortic Atherosclerosis (ICD10-I70.0).    Transthoracic Echocardiogram - pending 00/00/00        PHYSICAL EXAM Blood pressure (!) 142/88, pulse 70, temperature 98.5 F (36.9 C), temperature source  Oral, resp. rate 16, height 5\' 2"  (1.575 m), weight 65.8 kg, SpO2 90 %.  Pleasant elderly lady currently not in distress. . Afebrile. Head is nontraumatic. Neck is supple without bruit.    Cardiac exam no murmur or gallop. Lungs are clear to auscultation. Distal pulses are well felt. Neurological Exam ;  Awake  Alert oriented x 3. Normal speech and language.eye movements full without nystagmus.fundi were not visualized. Vision acuity and fields appear normal. Hearing is normal. Palatal movements are normal. Face asymmetric mild right lower facial weakness when she smiles.. Tongue midline. Normal strength, tone, reflexes and coordination except mild diminished fine finger movements on the right.right grip is weak. Orbits left over right upper extremity..mild weakness of right hip flexors and ankle dorsiflexors only. Normal sensation. Gait deferred.          ASSESSMENT/PLAN Ms. Brandy Barnett is a 84 y.o. female with history of previous stroke without residual deficits, hypertension, orthostatic hypotension, diabetes mellitus, and thyroid disease presenting with transient loss of consciousness and right-sided weakness.  IV tPA Friday 02/02/2018 at 21:15  Stroke:  MRI pending  Resultant  Mild right hemiparesis  CT head - No intracranial hemorrhage.  MRI head - pending  MRA head - not performed  CTA H&N - Negative CTA for large vessel occlusion.  Carotid Doppler - CTA neck performed - carotid dopplers not indicated.  2D Echo - pending  LDL - 34  HgbA1c - 6  VTE prophylaxis - SCDs  Diet - Heart healthy / carb modified with thin liquids.  aspirin 81 mg daily prior to admission, now on No antithrombotic S/P tPA  Patient will be counseled to be compliant with her antithrombotic medications  Ongoing aggressive stroke risk factor management  Therapy recommendations:  pending  Disposition:  Pending  Hypertension  Stable -on Cleviprex drip . Permissive hypertension -systolic  blood pressure goal less than 180mmHg status post TPA . Long-term BP goal normotensive  Hyperlipidemia  Lipid lowering medication PTA:  Crestor 40 mg daily  LDL 34, goal < 70  Current lipid lowering medication: resume Crestor 10 mg daily (per pharmacy recommendation)  Continue statin at discharge  Diabetes  HgbA1c 6, goal < 7.0  Controlled  Other Stroke Risk Factors  Advanced age  Hx stroke/TIA   Other Active Problems  Renal insufficiency  Mild anemia  Hypokalemia  PLAN  Add prn Labetalol - resume home Norvasc - later will add ACE I - wean cleviprex  CKD - following CTA - 1.8 / 46 -> hydrate - avoid diuretic and ACE inhibitor for now  Resume home dose Norvasc  DM - CBGs and SSI - hold Glucatrol and Glucophage for now [avoid Glucophage with elevated Cr]  Heart healthy / carb modified diet    Cymbalta PTA - hold for now.  Mild anemia  Hypokalemia - supplement - recheck  Resume Crestor (40 mg daily PTA - max dose for this pt per pharmacy 10 mg daily)  Resume antiplatelet medication if follow-up imaging is clear of hemorrhage.    Hospital day # 1  I have personally examined this patient, reviewed notes, independently viewed imaging studies, participated in medical decision making and plan of care.ROS completed by me personally and pertinent positives fully documented  I have made any additions or clarifications directly to the above note.  He presented with brief loss of consciousness followed by right hemiparesis likely due to left brain subcortical infarct. She received IV tPA and needs close neurological monitoring and strict blood pressure control as per post TPA protocol. Check MRI scan of the brain dated today. Physical occupational therapy consults. Mobilize out of bed. Resume home medications when able to swallow safely. Long discussion with the patient and her nurse the bedside and answered questions.This patient is critically ill and at significant  risk of neurological worsening, death and care requires constant monitoring of vital signs, hemodynamics,respiratory and cardiac monitoring, extensive review of multiple databases, frequent neurological assessment, discussion with family, other specialists and medical decision making of high complexity.I have made any additions or clarifications directly to the above note.This critical care time does not reflect procedure time, or teaching time or supervisory time of PA/NP/Med Resident etc but could involve care discussion time.  I spent 34 minutes of neurocritical care time  in the care of  this patient.      Delia Heady, MD Medical Director [facility] Pager: [phone] 02/03/2018 2:30 PM   To contact Stroke Continuity provider, please refer to [email or web address]. After hours, contact General Neurology

## 2018-02-04 ENCOUNTER — Encounter (HOSPITAL_COMMUNITY): Payer: Self-pay

## 2018-02-04 ENCOUNTER — Other Ambulatory Visit: Payer: Self-pay

## 2018-02-04 LAB — ECHOCARDIOGRAM COMPLETE
HEIGHTINCHES: 62 in
Weight: 2321 oz

## 2018-02-04 LAB — CBC
HCT: 35.7 % — ABNORMAL LOW (ref 36.0–46.0)
Hemoglobin: 11.7 g/dL — ABNORMAL LOW (ref 12.0–15.0)
MCH: 29.9 pg (ref 26.0–34.0)
MCHC: 32.8 g/dL (ref 30.0–36.0)
MCV: 91.3 fL (ref 78.0–100.0)
PLATELETS: 191 10*3/uL (ref 150–400)
RBC: 3.91 MIL/uL (ref 3.87–5.11)
RDW: 12.5 % (ref 11.5–15.5)
WBC: 10.5 10*3/uL (ref 4.0–10.5)

## 2018-02-04 LAB — BASIC METABOLIC PANEL
Anion gap: 9 (ref 5–15)
BUN: 39 mg/dL — AB (ref 8–23)
CALCIUM: 8.7 mg/dL — AB (ref 8.9–10.3)
CO2: 21 mmol/L — ABNORMAL LOW (ref 22–32)
CREATININE: 1.66 mg/dL — AB (ref 0.44–1.00)
Chloride: 110 mmol/L (ref 98–111)
GFR calc Af Amer: 32 mL/min — ABNORMAL LOW (ref >60)
GFR, EST NON AFRICAN AMERICAN: 27 mL/min — AB (ref >60)
GLUCOSE: 126 mg/dL — AB (ref 70–99)
Potassium: 3.9 mmol/L (ref 3.5–5.1)
SODIUM: 140 mmol/L (ref 135–145)

## 2018-02-04 LAB — GLUCOSE, CAPILLARY
GLUCOSE-CAPILLARY: 127 mg/dL — AB (ref 70–99)
GLUCOSE-CAPILLARY: 129 mg/dL — AB (ref 70–99)
GLUCOSE-CAPILLARY: 141 mg/dL — AB (ref 70–99)
Glucose-Capillary: 126 mg/dL — ABNORMAL HIGH (ref 70–99)

## 2018-02-04 MED ORDER — AMLODIPINE BESYLATE 10 MG PO TABS
10.0000 mg | ORAL_TABLET | Freq: Every day | ORAL | Status: DC
  Administered 2018-02-04 – 2018-02-06 (×3): 10 mg via ORAL
  Filled 2018-02-04: qty 2
  Filled 2018-02-04: qty 1
  Filled 2018-02-04: qty 2
  Filled 2018-02-04: qty 1

## 2018-02-04 MED ORDER — LABETALOL HCL 5 MG/ML IV SOLN: 10.0000 mg | INTRAVENOUS | Status: DC | PRN

## 2018-02-04 NOTE — Progress Notes (Signed)
Physical Therapy Treatment Patient Details Name: Brandy Barnett MRN: [id number] DOB: May 25, 1933 Today's Date: 02/04/2018    History of Present Illness 84 yo admitted with right weakness with left brain subcortical infarct s/p tPA. PMhx: CVA without deficit, DM, HTN, thyroid disease    PT Comments    Pt pleasant on arrival, eager to move around. Pt vitals taken in bed w/ HOB 45%: HR 78, SpO2 RA 99, RR 16, BP 199/96 (123) (right arm). BP retaken left arm 216/105 (134). Nurse called and informed had verbal orders for BP up to 220. Pt sat EOB 3 minutes BP: 216/99 (127). Pt reports mild dizziness. Pt stand pivot transfer to chair w/ BP 201/84 (112). Assist to advance RLE w/ transfers and RUE unable to assist up off bed. Pt w/ decreased strength, coordination, mobility (see PT problem list for all). Pt will benefit from acute physical therapy to address above deficits to increase functional mobility, independence, and decrease fall risk.     Follow Up Recommendations  CIR;Supervision/Assistance - 24 hour     Equipment Recommendations  3in1 (PT);Other (comment)(TBD)    Recommendations for Other Services OT consult;Rehab consult     Precautions / Restrictions Precautions Precautions: Fall Precaution Comments: pt reports several falls Restrictions Weight Bearing Restrictions: No    Mobility  Bed Mobility Overal bed mobility: Needs Assistance Bed Mobility: Rolling;Sidelying to Sit Rolling: Min guard;Min assist Sidelying to sit: Min assist       General bed mobility comments: min assist to roll left, minguard to right, assist to elevate trunk from surface  Transfers Overall transfer level: Needs assistance   Transfers: Sit to/from Stand;Stand Pivot Transfers Sit to Stand: Min assist Stand pivot transfers: Mod assist;+2 physical assistance       General transfer comment: cues for posture and HHA to stand, mod assist to pivot with +2 for safety and advancing RLE.   Ambulation/Gait              General Gait Details: unable due to BP   Stairs             Wheelchair Mobility    Modified Rankin (Stroke Patients Only) Modified Rankin (Stroke Patients Only) Pre-Morbid Rankin Score: No significant disability Modified Rankin: Moderately severe disability     Balance                                            Cognition Arousal/Alertness: Awake/alert Behavior During Therapy: WFL for tasks assessed/performed Overall Cognitive Status: Within Functional Limits for tasks assessed                                        Exercises      General Comments        Pertinent Vitals/Pain Pain Assessment: 0-10 Pain Score: 2  Pain Location: posterior head Pain Descriptors / Indicators: Aching Pain Intervention(s): Limited activity within patient's tolerance;Repositioned;Premedicated before session;Monitored during session    Home Living Family/patient expects to be discharged to:: Private residence Living Arrangements: Children Available Help at Discharge: Family Type of Home: House Home Access: Stairs to enter Entrance Stairs-Rails: Right;Left(cannot reach both at same time. ) Home Layout: One level Home Equipment: Shower seat - built in;Walker - 2 wheels;Gilmer Mor - single point      Prior Function  Level of Independence: Independent          PT Goals (current goals can now be found in the care plan section) Acute Rehab PT Goals Patient Stated Goal: go home, read and play with dogs PT Goal Formulation: With patient/family    Frequency    Min 4X/week      PT Plan      Co-evaluation              AM-PAC PT "6 Clicks" Daily Activity  Outcome Measure  Difficulty turning over in bed (including adjusting bedclothes, sheets and blankets)?: A Lot Difficulty moving from lying on back to sitting on the side of the bed? : Unable Difficulty sitting down on and standing up from a chair with arms (e.g.,  wheelchair, bedside commode, etc,.)?: A Lot Help needed moving to and from a bed to chair (including a wheelchair)?: A Lot Help needed walking in hospital room?: A Lot Help needed climbing 3-5 steps with a railing? : Total 6 Click Score: 10    End of Session Equipment Utilized During Treatment: Gait belt Activity Tolerance: Patient tolerated treatment well;Treatment limited secondary to medical complications (Comment) Patient left: in chair;with family/visitor present;with call bell/phone within reach Nurse Communication: Mobility status PT Visit Diagnosis: Other abnormalities of gait and mobility (R26.89);Muscle weakness (generalized) (M62.81);Unsteadiness on feet (R26.81);Hemiplegia and hemiparesis Hemiplegia - Right/Left: Right     Time: 1009-1050 PT Time Calculation (min) (ACUTE ONLY): 41 min  Charges:  $Therapeutic Activity: 8-22 mins                     Carma Lair, [place]  Acute Rehab [phone]    Carma Lair 02/04/2018, 11:16 AM

## 2018-02-04 NOTE — Progress Notes (Signed)
STROKE TEAM PROGRESS NOTE      SUBJECTIVE (INTERVAL HISTORY) There are no family members present.  The patient Still has some right-sided weakness. MRI scan actually did not show an acute infarct. CT angiogram of the brain and neck did not show no significant large vessel stenosis or occlusion. Echocardiogram is yet pending. Blood pressure is adequately controlled. She has not yet been evaluated by therapy.   OBJECTIVE Vitals:   02/04/18 1045 02/04/18 1100 02/04/18 1130 02/04/18 1200  BP: (!) 179/101 (!) 184/103 (!) 223/106 (!) 194/110  Pulse: 74 67 78 68  Resp: 16 16 13 15   Temp:      TempSrc:      SpO2: 97% 97% 97% 97%  Weight:      Height:        CBC:  Recent Labs  Lab 02/02/18 2129 02/02/18 2145 02/04/18 0310  WBC 9.4  --  10.5  NEUTROABS 7.0  --   --   HGB 11.8* 11.2* 11.7*  HCT 34.7* 33.0* 35.7*  MCV 91.3  --  91.3  PLT 287  --  191    Basic Metabolic Panel:  Recent Labs  Lab 02/02/18 2129 02/02/18 2145 02/04/18 0310  NA 134* 135 140  K 3.7 3.4* 3.9  CL 102 101 110  CO2 21*  --  21*  GLUCOSE 141* 138* 126*  BUN 52* 46* 39*  CREATININE 1.70* 1.80* 1.66*  CALCIUM 8.5*  --  8.7*    Lipid Panel:     Component Value Date/Time   CHOL 83 02/03/2018 0323   TRIG 58 02/03/2018 0323   HDL 37 (L) 02/03/2018 0323   CHOLHDL 2.2 02/03/2018 0323   VLDL 12 02/03/2018 0323   LDLCALC 34 02/03/2018 0323   HgbA1c:  Lab Results  Component Value Date   HGBA1C 6.0 (H) 02/03/2018   Urine Drug Screen: No results found for: LABOPIA, COCAINSCRNUR, LABBENZ, AMPHETMU, THCU, LABBARB  Alcohol Level No results found for: ETH  IMAGING  Ct Angio Head W Or Wo Contrast Ct Angio Neck W Or Wo Contrast 02/02/2018 IMPRESSION:  1. Negative CTA for large vessel occlusion. No hemodynamically significant or correctable stenosis identified.  2. Mild atherosclerotic change about the carotid bifurcations and carotid siphons for patient age.  3. Diffuse vascular tortuosity,  suggesting chronic underlying hypertension.    Ct Head Code Stroke Wo Contrast 02/02/2018 IMPRESSION:  1. No intracranial hemorrhage. Areas of hyperdensity at the midbrain are slightly increased compared to 12/12/2010 and likely due to a degree of parenchymal mineralization.  2. ASPECTS is 10.    Ct C-spine No Charge 02/02/2018 IMPRESSION:  1. No acute fracture or static subluxation of the cervical spine.  2. Severe multilevel facet arthrosis.   Dg Chest Port 1 View 02/02/2018 IMPRESSION:  1. Cardiomegaly and chronic interstitial changes.  2. Moderate hiatal hernia.  3.  Aortic Atherosclerosis (ICD10-I70.0).    Transthoracic Echocardiogram - pending 00/00/00        PHYSICAL EXAM Blood pressure (!) 194/110, pulse 68, temperature 97.8 F (36.6 C), temperature source Oral, resp. rate 15, height 5\' 2"  (1.575 m), weight 65.8 kg, SpO2 97 %.  Pleasant elderly lady currently not in distress. . Afebrile. Head is nontraumatic. Neck is supple without bruit.    Cardiac exam no murmur or gallop. Lungs are clear to auscultation. Distal pulses are well felt. Neurological Exam ;  Awake  Alert oriented x 3. Normal speech and language.eye movements full without nystagmus.fundi were not visualized. Vision acuity and  fields appear normal. Hearing is normal. Palatal movements are normal. Face  symmetric  . Tongue midline. Normal strength, tone, reflexes and coordination except mild diminished fine finger movements on the right.right grip is weak. Orbits left over right upper extremity..mild weakness of right hip flexors and ankle dorsiflexors only. Normal sensation. Gait deferred.          ASSESSMENT/PLAN Brandy Barnett is a 84 y.o. female with history of previous stroke without residual deficits, hypertension, orthostatic hypotension, diabetes mellitus, and thyroid disease presenting with transient loss of consciousness and right-sided weakness.  IV tPA Friday 02/02/2018 at  21:15  suspected small left subcorticalStroke: not visible on MRI  Resultant  Mild right hemiparesis  CT head - No intracranial hemorrhage.  MRI head - no acute infarct  MRA head - not performed  CTA H&N - Negative CTA for large vessel occlusion.  Carotid Doppler - CTA neck performed - carotid dopplers not indicated.  2D Echo - pending  LDL - 34  HgbA1c - 6  VTE prophylaxis - SCDs  Diet - Heart healthy / carb modified with thin liquids.  aspirin 81 mg daily prior to admission, now on No antithrombotic S/P tPA  Patient will be counseled to be compliant with her antithrombotic medications  Ongoing aggressive stroke risk factor management  Therapy recommendations:  pending  Disposition:  Pending  Hypertension  Stable -on Cleviprex drip . Permissive hypertension -systolic blood pressure goal less than status post TPA . Long-term BP goal normotensive  Hyperlipidemia  Lipid lowering medication PTA:  Crestor 40 mg daily  LDL 34, goal < 70  Current lipid lowering medication: resume Crestor 10 mg daily (per pharmacy recommendation)  Continue statin at discharge  Diabetes  HgbA1c 6, goal < 7.0  Controlled  Other Stroke Risk Factors  Advanced age  Hx stroke/TIA   Other Active Problems  Renal insufficiency  Mild anemia  Hypokalemia      Hospital day # 2   She presented with brief loss of consciousness followed by right hemiparesis likely due to left brain subcortical infarct. She received IV tPA  And has made good clinical recovery. MRI scan does not show definite left brain infarct but she still has weakness there is strong suspicion for a small infarct not visualized on MRI.Marland Kitchen Marland KitchenPhysical occupational therapy consults. Mobilize out of bed. Resumed home medications  Check echocardiogram results. Transfer to neurology floor bed. Long discussion with the patient and her nurse at  the bedside and answered questions. Greater than 50% time during  this 35 minute visit was spent on counseling and coordination of care about stroke and discussion about evaluation and treatment plan and answered questions     Delia Heady, MD Medical Director [facility] Pager: [phone] 02/04/2018 12:39 PM   To contact Stroke Continuity provider, please refer to [email or web address]. After hours, contact General Neurology

## 2018-02-04 NOTE — Evaluation (Signed)
Speech Language Pathology Evaluation Patient Details Name: LINETTE OSTERMEYER MRN: [id number] DOB: Jul 09, 1932 Today's Date: 02/04/2018 Time:  -     Problem List:  Patient Active Problem List   Diagnosis Date Noted  . Stroke (cerebrum) (HCC) 02/03/2018  . Acute ischemic stroke (HCC) 02/02/2018   Past Medical History:  Past Medical History:  Diagnosis Date  . Diabetes mellitus   . Hypertension   . Stroke (HCC)   . Thyroid disease    Past Surgical History:  Past Surgical History:  Procedure Laterality Date  . JOINT REPLACEMENT     HPI:  Ms. RELIA KEILLOR is a 84 y.o. female with history of previous stroke without residual deficits, hypertension, orthostatic hypotension, diabetes mellitus, and thyroid disease presenting with transient loss of consciousness and right-sided weakness.  IV tPA Friday 02/02/2018. MRI No acute intracranial process.   Assessment / Plan / Recommendation Clinical Impression  Pt demonstrates cognition, speech and language WNL. MoCA administered, WNL. Will sign off.     SLP Assessment  SLP Recommendation/Assessment: Patient does not need any further Speech Lanaguage Pathology Services    Follow Up Recommendations       Frequency and Duration           SLP Evaluation Cognition  Overall Cognitive Status: Within Functional Limits for tasks assessed Orientation Level: Oriented X4       Comprehension  Auditory Comprehension Overall Auditory Comprehension: Appears within functional limits for tasks assessed    Expression Verbal Expression Overall Verbal Expression: Appears within functional limits for tasks assessed Written Expression Dominant Hand: Right Written Expression: Unable to assess (comment)   Oral / Motor  Oral Motor/Sensory Function Overall Oral Motor/Sensory Function: Within functional limits Motor Speech Overall Motor Speech: Appears within functional limits for tasks assessed   GO                    Vallerie Hentz, Riley Nearing 02/04/2018,  11:02 AM

## 2018-02-04 NOTE — Progress Notes (Signed)
Per verbal order from Dr. Pearlean Brownie, cleviprex gtt stopped and SBP parameters are now <220.

## 2018-02-05 ENCOUNTER — Encounter (HOSPITAL_COMMUNITY): Payer: Self-pay | Admitting: Physical Medicine and Rehabilitation

## 2018-02-05 DIAGNOSIS — I10 Essential (primary) hypertension: Secondary | ICD-10-CM

## 2018-02-05 DIAGNOSIS — I639 Cerebral infarction, unspecified: Principal | ICD-10-CM

## 2018-02-05 DIAGNOSIS — G8191 Hemiplegia, unspecified affecting right dominant side: Secondary | ICD-10-CM

## 2018-02-05 LAB — BASIC METABOLIC PANEL
ANION GAP: 9 (ref 5–15)
BUN: 29 mg/dL — ABNORMAL HIGH (ref 8–23)
CHLORIDE: 112 mmol/L — AB (ref 98–111)
CO2: 22 mmol/L (ref 22–32)
Calcium: 8.7 mg/dL — ABNORMAL LOW (ref 8.9–10.3)
Creatinine, Ser: 1.43 mg/dL — ABNORMAL HIGH (ref 0.44–1.00)
GFR, EST AFRICAN AMERICAN: 38 mL/min — AB (ref >60)
GFR, EST NON AFRICAN AMERICAN: 33 mL/min — AB (ref >60)
Glucose, Bld: 102 mg/dL — ABNORMAL HIGH (ref 70–99)
POTASSIUM: 3.8 mmol/L (ref 3.5–5.1)
SODIUM: 143 mmol/L (ref 135–145)

## 2018-02-05 LAB — GLUCOSE, CAPILLARY
GLUCOSE-CAPILLARY: 107 mg/dL — AB (ref 70–99)
GLUCOSE-CAPILLARY: 277 mg/dL — AB (ref 70–99)
GLUCOSE-CAPILLARY: 78 mg/dL (ref 70–99)
Glucose-Capillary: 118 mg/dL — ABNORMAL HIGH (ref 70–99)

## 2018-02-05 NOTE — Progress Notes (Signed)
STROKE TEAM PROGRESS NOTE      SUBJECTIVE (INTERVAL HISTORY) Her son is at the bedside .  The patient yetl has some right-sided weakness.  . Echocardiogram was unremarkable. Blood pressure is adequately controlled. Rehabilitation has been recommended by physical therapy   OBJECTIVE Vitals:   02/04/18 2354 02/05/18 0332 02/05/18 0746 02/05/18 1209  BP: 134/69 (!) 156/79 (!) 172/82 (!) 167/78  Pulse: 81 79 87 81  Resp: 20 18 18 18   Temp: 97.9 F (36.6 C) 97.8 F (36.6 C) 97.6 F (36.4 C) 97.9 F (36.6 C)  TempSrc: Oral Oral Oral Oral  SpO2: 99% 98% 97% 99%  Weight:      Height:        CBC:  Recent Labs  Lab 02/02/18 2129 02/02/18 2145 02/04/18 0310  WBC 9.4  --  10.5  NEUTROABS 7.0  --   --   HGB 11.8* 11.2* 11.7*  HCT 34.7* 33.0* 35.7*  MCV 91.3  --  91.3  PLT 287  --  191    Basic Metabolic Panel:  Recent Labs  Lab 02/04/18 0310 02/05/18 0303  NA 140 143  K 3.9 3.8  CL 110 112*  CO2 21* 22  GLUCOSE 126* 102*  BUN 39* 29*  CREATININE 1.66* 1.43*  CALCIUM 8.7* 8.7*    Lipid Panel:     Component Value Date/Time   CHOL 83 02/03/2018 0323   TRIG 58 02/03/2018 0323   HDL 37 (L) 02/03/2018 0323   CHOLHDL 2.2 02/03/2018 0323   VLDL 12 02/03/2018 0323   LDLCALC 34 02/03/2018 0323   HgbA1c:  Lab Results  Component Value Date   HGBA1C 6.0 (H) 02/03/2018   Urine Drug Screen: No results found for: LABOPIA, COCAINSCRNUR, LABBENZ, AMPHETMU, THCU, LABBARB  Alcohol Level No results found for: ETH  IMAGING  Ct Angio Head W Or Wo Contrast Ct Angio Neck W Or Wo Contrast 02/02/2018 IMPRESSION:  1. Negative CTA for large vessel occlusion. No hemodynamically significant or correctable stenosis identified.  2. Mild atherosclerotic change about the carotid bifurcations and carotid siphons for patient age.  3. Diffuse vascular tortuosity, suggesting chronic underlying hypertension.    Ct Head Code Stroke Wo Contrast 02/02/2018 IMPRESSION:  1. No intracranial  hemorrhage. Areas of hyperdensity at the midbrain are slightly increased compared to 12/12/2010 and likely due to a degree of parenchymal mineralization.  2. ASPECTS is 10.    Ct C-spine No Charge 02/02/2018 IMPRESSION:  1. No acute fracture or static subluxation of the cervical spine.  2. Severe multilevel facet arthrosis.   Dg Chest Port 1 View 02/02/2018 IMPRESSION:  1. Cardiomegaly and chronic interstitial changes.  2. Moderate hiatal hernia.  3.  Aortic Atherosclerosis (ICD10-I70.0).    Transthoracic Echocardiogram - pending 00/00/00        PHYSICAL EXAM Blood pressure (!) 167/78, pulse 81, temperature 97.9 F (36.6 C), temperature source Oral, resp. rate 18, height 5\' 2"  (1.575 m), weight 65.8 kg, SpO2 99 %.  Pleasant elderly lady currently not in distress. . Afebrile. Head is nontraumatic. Neck is supple without bruit.    Cardiac exam no murmur or gallop. Lungs are clear to auscultation. Distal pulses are well felt. Neurological Exam ;  Awake  Alert oriented x 3. Normal speech and language.eye movements full without nystagmus.fundi were not visualized. Vision acuity and fields appear normal. Hearing is normal. Palatal movements are normal. Face  symmetric  . Tongue midline. Normal strength, tone, reflexes and coordination except mild diminished fine finger  movements on the right.right grip is weak. Orbits left over right upper extremity..mild weakness of right hip flexors and ankle dorsiflexors only. Normal sensation. Gait deferred.          ASSESSMENT/PLAN Ms. MONROE PRUCHA is a 84 y.o. female with history of previous stroke without residual deficits, hypertension, orthostatic hypotension, diabetes mellitus, and thyroid disease presenting with transient loss of consciousness and right-sided weakness.  IV tPA Friday 02/02/2018 at 21:15  suspected small left subcorticalStroke: not visible on MRI  Resultant  Mild right hemiparesis  CT head - No intracranial  hemorrhage.  MRI head - no acute infarct  MRA head - not performed  CTA H&N - Negative CTA for large vessel occlusion.  Carotid Doppler - CTA neck performed - carotid dopplers not indicated. 2D Echo - Left ventricle: The cavity size was normal. Systolic function was   normal. The estimated ejection fraction was in the range of 60%   to 65%. Wall motion was normal; there were no regional wall    motion abnormalities  LDL - 34  HgbA1c - 6  VTE prophylaxis - SCDs  Diet - Heart healthy / carb modified with thin liquids.  aspirin 81 mg daily prior to admission, now on No antithrombotic S/P tPA  Patient will be counseled to be compliant with her antithrombotic medications  Ongoing aggressive stroke risk factor management  Therapy recommendations:  pending  Disposition:  Pending  Hypertension  Stable -on Cleviprex drip . Permissive hypertension -systolic blood pressure goal less than status post TPA . Long-term BP goal normotensive  Hyperlipidemia  Lipid lowering medication PTA:  Crestor 40 mg daily  LDL 34, goal < 70  Current lipid lowering medication: resume Crestor 10 mg daily (per pharmacy recommendation)  Continue statin at discharge  Diabetes  HgbA1c 6, goal < 7.0  Controlled  Other Stroke Risk Factors  Advanced age  Hx stroke/TIA   Other Active Problems  Renal insufficiency  Mild anemia  Hypokalemia      Hospital day # 3   She presented with brief loss of consciousness followed by right hemiparesis likely due to left brain subcortical infarct. She received IV tPA  and has made good clinical recovery. MRI scan does not show definite left brain infarct but she still has weakness there is strong suspicion for a small infarct not visualized on MRI.. . Continue Physical  And occupational therapy  -transfer to inpatient rehabilitation when bed available.     Delia Heady, MD Medical Director [facility] Pager:  [phone] 02/05/2018 12:50 PM   To contact Stroke Continuity provider, please refer to [email or web address]. After hours, contact General Neurology

## 2018-02-05 NOTE — Progress Notes (Addendum)
Inpatient Rehabilitation-Admissions Coordinator    Met with patient at the bedside to discuss team's recommendation for inpatient rehabilitation. Shared booklets, expectations while in CIR, expected length of stay, and anticipated functional level at DC. Spoke with pt's son who lives with her and he confrimed DC plan of intermittant Supervision. Pt wants to pursue CIR at this time.   We will plan to admit pt to CIR tomorrow, pending medical readiness/approval.   Please call if questions.   Jhonnie Garner, OTR/L  Rehab Admissions Coordinator  [phone] 02/05/2018 2:17 PM

## 2018-02-05 NOTE — Consult Note (Signed)
Physical Medicine and Rehabilitation Consult   Reason for Consult: Functional decline due to stroke Referring Physician: Dr. Pearlean Brownie   HPI: Brandy Barnett is a 84 y.o. female T2DM with CKD stage IV/V, HTN, anemia;  who was admitted on 01/24/2019 transient loss of consciousness with weakness right side, fall and inability to walk.  CT of head negative for bleed.  TPA administered.  CTA head neck was negative for large vessel occlusion or significant stenosis.  MRI/MRA brain done showing no acute intracranial process, mild to moderate chronic small vessel changes and advanced brainstem atrophy and basal ganglia mineralization with question of multisystem atrophy or other neurodegenerative syndromes.  2D echo done showing EF of 60-65%, no wall abnormality and grade 1 diastolic dysfunction. Dr. Pearlean Brownie felt that patient with small subcortical stroke not seen on x ray. Therapy evaluations done revealing deficits due to elevated BP, weakness and decreased coordination. CIR recommended for follow up therapy.   Patient still feels weak in the right arm more than in the right leg Discussed with neurology, Dr. Pearlean Brownie Review of Systems  Constitutional: Negative for chills and fever.  HENT: Positive for hearing loss.   Eyes: Negative for blurred vision and double vision.  Respiratory: Negative for cough and shortness of breath.   Cardiovascular: Negative for chest pain and palpitations.  Gastrointestinal: Negative for constipation, heartburn and nausea.  Genitourinary: Negative for dysuria.  Musculoskeletal: Positive for falls (none since last past spring. ). Negative for myalgias and neck pain.  Skin: Negative for rash.  Neurological: Positive for focal weakness and headaches. Negative for sensory change and speech change.  Psychiatric/Behavioral: Negative for memory loss. The patient does not have insomnia.      Past Medical History:  Diagnosis Date  . Chronic kidney disease   . Diabetes mellitus    . Hypertension   . Iron deficiency anemia   . Low back pain   . Stroke (HCC)   . Thyroid disease     Past Surgical History:  Procedure Laterality Date  . EYE SURGERY    . JOINT REPLACEMENT    . TOTAL ABDOMINAL HYSTERECTOMY      Family History  Problem Relation Age of Onset  . Diabetes Mother   . Cancer Mother   . Cancer Father   . Diabetes Brother      Social History:  Lives with son and his wife. Son retired and they have assistance with home Insurance account manager. 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 Brandy Barnett reports that Brandy Barnett does not drink alcohol. Brandy Barnett does not use chew, alcohol or illicit drugs.     Allergies: No Known Allergies    Medications Prior to Admission  Medication Sig Dispense Refill  . acetaminophen (TYLENOL) 500 MG tablet Take 1,000 mg by mouth every 6 (six) hours as needed for headache (pain).    Marland Kitchen albuterol (PROVENTIL HFA;VENTOLIN HFA) 108 (90 Base) MCG/ACT inhaler Inhale 2 puffs into the lungs every 6 (six) hours as needed for wheezing or shortness of breath.    . Ascorbic Acid (VITAMIN C) 1000 MG tablet Take 1,000 mg by mouth daily.    Marland Kitchen aspirin 81 MG EC tablet Take 81 mg by mouth daily.      . DULoxetine (CYMBALTA) 30 MG capsule Take 30 mg by mouth daily.  3  . glipiZIDE (GLUCOTROL XL) 2.5 MG 24 hr tablet Take 2.5 mg by mouth daily.  1  . hydrochlorothiazide (HYDRODIURIL) 25 MG tablet Take 12.5 mg  by mouth every Monday, Wednesday, and Friday.  6  . Multiple Vitamins-Minerals (PRESERVISION AREDS 2) CAPS Take 1 capsule by mouth 2 (two) times daily.    . rosuvastatin (CRESTOR) 10 MG tablet Take 10 mg by mouth every evening.  3  . sevelamer carbonate (RENVELA) 800 MG tablet Take 800 mg by mouth daily with lunch.  0  . traMADol (ULTRAM) 50 MG tablet Take 50 mg by mouth daily as needed (pain).   0    Home: Home Living Family/patient expects to be discharged to:: Private residence Living Arrangements: Children Available Help at Discharge: Family Type  of Home: House Home Access: Stairs to enter Secretary/administrator of Steps: 3 Entrance Stairs-Rails: Right, Left(cannot reach both at same time. ) Home Layout: One level Bathroom Shower/Tub: Health visitor: Handicapped height Home Equipment: Information systems manager - built in, Environmental consultant - 2 wheels, Medical laboratory scientific officer - single point  Lives With: Son  Functional History: Prior Function Level of Independence: Independent Functional Status:  Mobility: Bed Mobility Overal bed mobility: Needs Assistance Bed Mobility: Rolling, Sidelying to Sit Rolling: Min guard, Min assist Sidelying to sit: Min assist General bed mobility comments: min assist to roll left, minguard to right, assist to elevate trunk from surface Transfers Overall transfer level: Needs assistance Transfers: Sit to/from Stand, Stand Pivot Transfers Sit to Stand: Min assist Stand pivot transfers: Mod assist, +2 physical assistance General transfer comment: cues for posture and HHA to stand, mod assist to pivot with +2 for safety and advancing RLE.  Ambulation/Gait General Gait Details: unable due to BP    ADL:    Cognition: Cognition Overall Cognitive Status: Within Functional Limits for tasks assessed Orientation Level: Oriented X4 Cognition Arousal/Alertness: Awake/alert Behavior During Therapy: WFL for tasks assessed/performed Overall Cognitive Status: Within Functional Limits for tasks assessed  Blood pressure (!) 172/82, pulse 87, temperature 97.6 F (36.4 C), temperature source Oral, resp. rate 18, height 5\' 2"  (1.575 m), weight 65.8 kg, SpO2 97 %. Physical Exam  Nursing note and vitals reviewed. Constitutional: Brandy Barnett is oriented to person, place, and time.  Pleasant and appropriate.   Musculoskeletal:  Right ankle with tight heel cord.  Pedal edema Left > right.   Neurological: Brandy Barnett is alert and oriented to person, place, and time.  Mild right facial weakness with mild dysarthria. decreased hearing. Few beats  nystagmus laterally. Brandy Barnett is able to follow basic commands without difficulty. Right hemiparesis.    Lungs clear Heart regular rate and rhythm no murmurs Abdomen positive bowel sounds soft nontender palpation Extremities no clubbing cyanosis or edema Motor strength is 2- in the right deltoid bicep tricep grip 3 at the right hip flexor knee extensor ankle dorsiflexor 5/5 in the left deltoid bicep tricep grip, hip flexor knee extensor ankle dorsi flexor. Sensation intact light touch Cannot perform cerebellar testing finger-nose-finger on the right side due to weakness Results for orders placed or performed during the hospital encounter of 02/02/18 (from the past 24 hour(s))  Glucose, capillary     Status: Abnormal   Collection Time: 02/04/18 12:39 PM  Result Value Ref Range   Glucose-Capillary 126 (H) 70 - 99 mg/dL  Glucose, capillary     Status: Abnormal   Collection Time: 02/04/18  5:03 PM  Result Value Ref Range   Glucose-Capillary 129 (H) 70 - 99 mg/dL  Glucose, capillary     Status: Abnormal   Collection Time: 02/04/18  9:51 PM  Result Value Ref Range   Glucose-Capillary 141 (H) 70 - 99  mg/dL   Comment 1 Notify RN    Comment 2 Document in Chart   Basic metabolic panel     Status: Abnormal   Collection Time: 02/05/18  3:03 AM  Result Value Ref Range   Sodium 143 135 - 145 mmol/L   Potassium 3.8 3.5 - 5.1 mmol/L   Chloride 112 (H) 98 - 111 mmol/L   CO2 22 22 - 32 mmol/L   Glucose, Bld 102 (H) 70 - 99 mg/dL   BUN 29 (H) 8 - 23 mg/dL   Creatinine, Ser 1.61 (H) 0.44 - 1.00 mg/dL   Calcium 8.7 (L) 8.9 - 10.3 mg/dL   GFR calc non Af Amer 33 (L) >60 mL/min   GFR calc Af Amer 38 (L) >60 mL/min   Anion gap 9 5 - 15  Glucose, capillary     Status: Abnormal   Collection Time: 02/05/18  6:13 AM  Result Value Ref Range   Glucose-Capillary 107 (H) 70 - 99 mg/dL   Comment 1 Notify RN    Comment 2 Document in Chart    Mr Brain Wo Contrast  Result Date: 02/03/2018 CLINICAL DATA:   Follow up stroke, status post tPA. History of hypertension and diabetes. EXAM: MRI HEAD WITHOUT CONTRAST TECHNIQUE: Multiplanar, multiecho pulse sequences of the brain and surrounding structures were obtained without intravenous contrast. COMPARISON:  CT HEAD February 02, 2018. FINDINGS: INTRACRANIAL CONTENTS: No reduced diffusion to suggest acute ischemia. No susceptibility artifact to suggest hemorrhage. Moderate parenchymal brain volume loss. No hydrocephalus. Symmetric basal ganglia mineralization. Patchy supratentorial white matter FLAIR T2 hyperintensities. Abnormal atrophy and symmetric T2 bright signal within the pyramids and inferior cerebellar peduncles. No suspicious parenchymal signal, masses, mass effect. No abnormal extra-axial fluid collections. No extra-axial masses. VASCULAR: Normal major intracranial vascular flow voids present at skull base. SKULL AND UPPER CERVICAL SPINE: No abnormal sellar expansion. No suspicious calvarial bone marrow signal. Craniocervical junction maintained. SINUSES/ORBITS: The mastoid air-cells and included paranasal sinuses are well-aerated.The included ocular globes and orbital contents are non-suspicious. Status post bilateral ocular lens implants. OTHER: None. IMPRESSION: 1. No acute intracranial process. 2. Advanced brainstem atrophy and basal ganglia mineralization, constellation of findings seen with multi-system atrophy or other neuro degenerative syndromes. 3. Mild-to-moderate chronic small vessel ischemic changes. Electronically Signed   By: Awilda Metro M.D.   On: 02/03/2018 22:32     Assessment/Plan: Diagnosis: Small subcortical infarct with residual right hemiparesis upper extremity groin lower extremity. 1. Does the need for close, 24 hr/day medical supervision in concert with the patient's rehab needs make it unreasonable for this patient to be served in a less intensive setting? Yes 2. Co-Morbidities requiring supervision/potential complications:  Diabetes requiring further management, hypertension requiring further management, chronic kidney disease requiring monitoring of medications and intake. 3. Due to bladder management, bowel management, safety, skin/wound care, disease management, medication administration, pain management and patient education, does the patient require 24 hr/day rehab nursing? Yes 4. Does the patient require coordinated care of a physician, rehab nurse, PT (1-2 hrs/day, 5 days/week), OT (1-2 hrs/day, 5 days/week) and SLP (.5-1 hrs/day, 5 days/week) to address physical and functional deficits in the context of the above medical diagnosis(es)? Yes Addressing deficits in the following areas: balance, endurance, locomotion, strength, transferring, bowel/bladder control, bathing, dressing, feeding, grooming, toileting, cognition, speech and psychosocial support 5. Can the patient actively participate in an intensive therapy program of at least 3 hrs of therapy per day at least 5 days per week? Yes 6. The potential  for patient to make measurable gains while on inpatient rehab is excellent 7. Anticipated functional outcomes upon discharge from inpatient rehab are modified independent and supervision  with PT, modified independent and supervision with OT, n/a with SLP. 8. Estimated rehab length of stay to reach the above functional goals is:8- 12 days 9. Anticipated D/C setting: Home 10. Anticipated post D/C treatments: HH therapy 11. Overall Rehab/Functional Prognosis: excellent  RECOMMENDATIONS: This patient's condition is appropriate for continued rehabilitative care in the following setting: CIR Patient has agreed to participate in recommended program. Yes Note that insurance prior authorization may be required for reimbursement for recommended care.   "I have personally performed a face to face diagnostic evaluation of this patient.  Additionally, I have reviewed and concur with the physician assistant's documentation  above." Erick Colace M.D. [facility] FAAPM&R (Sports Med, Neuromuscular Med) Diplomate Am Board of Electrodiagnostic Med  Jacquelynn Cree, PA-C 02/05/2018

## 2018-02-05 NOTE — Progress Notes (Signed)
Rehab Admissions Coordinator Note:  Per PT recommendation, Patient was screened by Nanine Means for appropriateness for an Inpatient Acute Rehab Consult.  At this time, we are recommending Inpatient Rehab consult. AC will contact MD regarding IP Rehab Consult Order.   Nanine Means 02/05/2018, 8:01 AM  I can be reached at [phone].

## 2018-02-05 NOTE — Care Management Important Message (Signed)
Important Message  Patient Details  Name: Brandy Barnett MRN: [id number] Date of Birth: 06/16/1932   Medicare Important Message Given:  No Due to illness patient is not able to sign.   Dejia Ebron 02/05/2018, 3:12 PM

## 2018-02-05 NOTE — PMR Pre-admission (Signed)
PMR Admission Coordinator Pre-Admission Assessment  Patient: Brandy Barnett is an 84 y.o., female MRN: [id number] DOB: 1932-09-06 Height: _0  (157.5 cm) Weight: 65.8 kg              Insurance Information HMO:     PPO:      PCP:      IPA:      80/20: Yes     OTHER:  PRIMARY: medicare part A and B      Policy#: [id number]      Subscriber: Patient CM Name:       Phone#:      Fax#:  Pre-Cert#:       Employer:  Benefits:  Phone #: NA     Name: Verified eligibility on 02/05/18 via [employer] Eff. Date: Part A effective 10/05/1999; Part B effective on 10/05/1999      Deduct: $1,364      Out of Pocket Max: NA      Life Max: NA CIR: Covered per Medicare Guidelines once yearly deductible is met      SNF: days 1-20, 100%; days 21-100, 80% Outpatient: 80%     Co-Pay: 20% Home Health: 100%      Co-Pay:  DME: 80%     Co-Pay: 20% Providers: Pt's Choice SECONDARY: BCBS       Policy#: [id number]      Subscriber: Patient CM Name:       Phone#:      Fax#:  Pre-Cert#:       Employer:  Benefits:  Phone #: [phone]     Name:  Eff. Date:      Deduct:       Out of Pocket Max:       Life Max:  CIR:       SNF:  Outpatient:      Co-Pay:  Home Health:       Co-Pay:  DME:      Co-Pay:   Medicaid Application Date:       Case Manager:  Disability Application Date:       Case Worker:   Emergency Contact Information Contact Information    Name Relation Home Work Mobile   Manny,Scott Son [phone]     Jurnee, [place] [phone]       Current Medical History  Patient Admitting Diagnosis: Small subcortical infarct with residual right hemiparesis upper extremity groin lower extremity.  History of Present Illness: Brandy Barnett is a 84 y.o. female T2DM with CKD stage IV/V, HTN, anemia;  who was admitted on 01/24/2019 transient loss of consciousness with weakness right side, fall and inability to walk.  CT of head negative for bleed.  TPA administered.  CTA head neck was negative for large vessel occlusion  or significant stenosis.  MRI/MRA brain done showing no acute intracranial process, mild to moderate chronic small vessel changes and advanced brainstem atrophy and basal ganglia mineralization with question of multisystem atrophy or other neurodegenerative syndromes.  2D echo done showing EF of 60-65%, no wall abnormality and grade 1 diastolic dysfunction. Dr. Leonie Man felt that patient with small subcortical stroke not seen on x ray. Therapy evaluations done revealing deficits due to elevated BP, weakness and decreased coordination. CIR recommended for follow up therapy.   Complete NIHSS TOTAL: 2    Past Medical History  Past Medical History:  Diagnosis Date  . Chronic kidney disease   . Diabetes mellitus   . Hypertension   . Iron deficiency anemia   .  Low back pain   . Stroke ([place])   . Thyroid disease     Family History  family history includes Cancer in her father and mother; Diabetes in her brother and mother.  Prior Rehab/Hospitalizations:  Has the patient had major surgery during 100 days prior to admission? No  Current Medications   Current Facility-Administered Medications:  .  0.9 %  sodium chloride infusion, , Intravenous, Continuous, Amie Portland, MD, Last Rate: 75 mL/hr at 02/05/18 2244 .  acetaminophen (TYLENOL) tablet 650 mg, 650 mg, Oral, Q4H PRN, 650 mg at 02/05/18 1528 **OR** acetaminophen (TYLENOL) solution 650 mg, 650 mg, Per Tube, Q4H PRN **OR** acetaminophen (TYLENOL) suppository 650 mg, 650 mg, Rectal, Q4H PRN, Amie Portland, MD .  amLODipine (NORVASC) tablet 10 mg, 10 mg, Oral, Daily, Rinehuls, David L, PA-C, 10 mg at 02/05/18 1123 .  aspirin tablet 325 mg, 325 mg, Oral, Daily, Amie Portland, MD, 325 mg at 02/05/18 1122 .  clevidipine (CLEVIPREX) infusion 0.5 mg/mL, 0-21 mg/hr, Intravenous, Continuous, Amie Portland, MD, Stopped at 02/04/18 [phone] .  insulin aspart (novoLOG) injection 0-9 Units, 0-9 Units, Subcutaneous, TID WC, Rinehuls, Early Chars, PA-C, 1 Units at  02/06/18 0811 .  labetalol (NORMODYNE,TRANDATE) injection 10 mg, 10 mg, Intravenous, Q4H PRN, Leonie Man, Pramod S, MD .  rosuvastatin (CRESTOR) tablet 10 mg, 10 mg, Oral, q1800, Rinehuls, David L, PA-C, 10 mg at 02/05/18 1729 .  senna-docusate (Senokot-S) tablet 1 tablet, 1 tablet, Oral, QHS PRN, Amie Portland, MD  Patients Current Diet:  Diet Order            Diet heart healthy/carb modified Room service appropriate? Yes; Fluid consistency: Thin  Diet effective now              Precautions / Restrictions Precautions Precautions: Fall Precaution Comments: pt reports several falls Restrictions Weight Bearing Restrictions: No   Has the patient had 2 or more falls or a fall with injury in the past year?Yes  Prior Activity Level Community (5-7x/wk): walk around yard; kept care of her Advertising account executive / Export Devices/Equipment: Built-in shower seat Home Equipment: Shower seat - built in, [place] - 2 wheels, [place] - single point  Prior Device Use: Indicate devices/aids used by the patient prior to current illness, exacerbation or injury? None of the above  Prior Functional Level Prior Function Level of Independence: Independent  Self Care: Did the patient need help bathing, dressing, using the toilet or eating?  Independent  Indoor Mobility: Did the patient need assistance with walking from room to room (with or without device)? Independent  Stairs: Did the patient need assistance with internal or external stairs (with or without device)? Independent  Functional Cognition: Did the patient need help planning regular tasks such as shopping or remembering to take medications? Independent  Current Functional Level Cognition  Overall Cognitive Status: Within Functional Limits for tasks assessed Orientation Level: Oriented X4    Extremity Assessment (includes Sensation/Coordination)  Upper Extremity Assessment: RUE deficits/detail RUE Deficits /  Details: grossly 2-/5 defer to OT for full eval  Lower Extremity Assessment: RLE deficits/detail RLE Deficits / Details: 3/5 hip flexion, 4/5 knee flexion and extension as well as dorsiflexion/plantarflexion, Hip ABD/Add 3+/5, decreased dorsiflexion PROM RLE Sensation: decreased light touch(decreased sensation mid thigh) RLE Coordination: decreased gross motor(lack of coordination for alternating and same direction toe tapping. RLE slower than LLE)    ADLs       Mobility  Overal bed mobility: Needs Assistance Bed Mobility:  Rolling, Sidelying to Sit Rolling: Min guard Sidelying to sit: Mod assist General bed mobility comments: assist to bring hips to EOB and to elevate trunk into sitting; use of rail    Transfers  Overall transfer level: Needs assistance Equipment used: 2 person hand held assist Transfers: Sit to/from Stand Sit to Stand: Min assist Stand pivot transfers: Mod assist, +2 physical assistance General transfer comment: assist to power up and for balance upon standing due to R side weakness    Ambulation / Gait / Stairs / Wheelchair Mobility  Ambulation/Gait Ambulation/Gait assistance: Mod assist, +2 physical assistance Gait Distance (Feet): 12 Feet Assistive device: 2 person hand held assist Gait Pattern/deviations: Step-to pattern, Decreased step length - left, Decreased stance time - right, Decreased dorsiflexion - right, Decreased weight shift to right, Ataxic, Narrow base of support General Gait Details: pt requires verbal and visual cues for R foot placement and mutlimodal cues for posture, weight shifting, and advancing R LE; R side genu recurvatum    Posture / Balance Balance Overall balance assessment: Needs assistance Sitting balance-Leahy Scale: Fair Standing balance support: Bilateral upper extremity supported Standing balance-Leahy Scale: Poor    Special needs/care consideration BiPAP/CPAP: no CPM: no Continuous Drip IV: no Dialysis: no        Days:  no Life Vest: no Oxygen: no Special Bed: no Trach Size: no Wound Vac (area): no      Location: No Skin: ecchymosis on posterior head                               Bowel mgmt:Last BM: 02/05/18, continent Bladder mgmt: external urinary catheter Diabetic mgmt: yes     Previous Home Environment Living Arrangements: Children  Lives With: Son Available Help at Discharge: Family Type of Home: House Home Layout: One level Home Access: Stairs to enter Entrance Stairs-Rails: Right, Left(cannot reach both at same time. ) Entrance Stairs-Number of Steps: 3 Bathroom Shower/Tub: Multimedia programmer: Handicapped height Home Care Services: No  Discharge Living Setting Plans for Discharge Living Setting: Patient's home, Lives with (comment)(son and daugther in law) Type of Home at Discharge: House Discharge Home Layout: Two level, Able to live on main level with bedroom/bathroom Alternate Level Stairs-Rails: None(unknown as pt lives on 1st floor) Discharge Home Access: Stairs to enter Entrance Stairs-Rails: None Entrance Stairs-Number of Steps: 2 Discharge Bathroom Shower/Tub: Walk-in shower(with built in Astronomer) Discharge Bathroom Toilet: Handicapped height Discharge Bathroom Accessibility: Yes How Accessible: Accessible via walker Does the patient have any problems obtaining your medications?: No  Social/Family/Support Systems Patient Roles: Other (Comment)(lives with son; takes care of her dogs) Contact Information: son is emergency contact Anticipated Caregiver: son Abe People) [phone] Anticipated Caregiver's Contact Information: see above Ability/Limitations of Caregiver: intermittant to close to 24/7 supervision between son and pt's daughter in law(supervision only due to health concerns from son) Caregiver Availability: Intermittent Discharge Plan Discussed with Primary Caregiver: Yes Is Caregiver In Agreement with Plan?: Yes Does Caregiver/Family have Issues with  Lodging/Transportation while Pt is in Rehab?: No   Goals/Additional Needs Patient/Family Goal for Rehab: PT/OT: Mod I/Supervision Expected length of stay: 8-12 days Cultural Considerations: [employer] Dietary Needs: heart healthy; carb modified; thin liquids Equipment Needs: TBD Pt/Family Agrees to Admission and willing to participate: Yes Program Orientation Provided & Reviewed with Pt/Caregiver Including Roles  & Responsibilities: Yes(pt and son)  Barriers to Discharge: Home environment access/layout, Decreased caregiver support  Barriers to Discharge Comments: 2  steps to enter home (through garage) no rails   Decrease burden of Care through IP rehab admission: NA   Possible need for SNF placement upon discharge: not anticipated; pt has good prognosis for further progress with good social support at home.    Patient Condition: This patient's condition remains as documented in the consult dated 02/05/18, in which the Rehabilitation Physician determined and documented that the patient's condition is appropriate for intensive rehabilitative care in an inpatient rehabilitation facility. Will admit to inpatient rehab today.  Preadmission Screen Completed By:  Jhonnie Garner, 02/06/2018 9:59 AM ______________________________________________________________________   Discussed status with Dr. Posey Pronto on 02/06/18 at 10:00AM and received telephone approval for admission today.  Admission Coordinator:  Jhonnie Garner, time 10:00AM/Date 02/06/18

## 2018-02-05 NOTE — Progress Notes (Signed)
Physical Therapy Treatment Patient Details Name: Brandy Barnett MRN: [id number] DOB: 12/18/1932 Today's Date: 02/05/2018    History of Present Illness 84 yo admitted with right weakness with left brain subcortical infarct s/p tPA. PMhx: CVA without deficit, DM, HTN, thyroid disease    PT Comments    Patient seen for mobility progression. Pt is making progress toward PT goals and able to ambulate short distance with +2 assist this session. Pt without c/o dizziness. Son present throughout. Continue to recommend CIR for further skilled PT services to maximize independence and safety with mobility.    Follow Up Recommendations  CIR;Supervision/Assistance - 24 hour     Equipment Recommendations  Other (comment)(TBD)    Recommendations for Other Services OT consult;Rehab consult     Precautions / Restrictions Precautions Precautions: Fall Precaution Comments: pt reports several falls Restrictions Weight Bearing Restrictions: No    Mobility  Bed Mobility Overal bed mobility: Needs Assistance Bed Mobility: Rolling;Sidelying to Sit Rolling: Min guard Sidelying to sit: Mod assist       General bed mobility comments: assist to bring hips to EOB and to elevate trunk into sitting; use of rail  Transfers Overall transfer level: Needs assistance Equipment used: 2 person hand held assist Transfers: Sit to/from Stand Sit to Stand: Min assist         General transfer comment: assist to power up and for balance upon standing due to R side weakness  Ambulation/Gait Ambulation/Gait assistance: Mod assist;+2 physical assistance Gait Distance (Feet): 12 Feet Assistive device: 2 person hand held assist Gait Pattern/deviations: Step-to pattern;Decreased step length - left;Decreased stance time - right;Decreased dorsiflexion - right;Decreased weight shift to right;Ataxic;Narrow base of support     General Gait Details: pt requires verbal and visual cues for R foot placement and mutlimodal  cues for posture, weight shifting, and advancing R LE; R side genu recurvatum   Stairs             Wheelchair Mobility    Modified Rankin (Stroke Patients Only) Modified Rankin (Stroke Patients Only) Pre-Morbid Rankin Score: No significant disability Modified Rankin: Moderately severe disability     Balance Overall balance assessment: Needs assistance   Sitting balance-Leahy Scale: Fair     Standing balance support: Bilateral upper extremity supported Standing balance-Leahy Scale: Poor                              Cognition Arousal/Alertness: Awake/alert Behavior During Therapy: WFL for tasks assessed/performed Overall Cognitive Status: Within Functional Limits for tasks assessed                                        Exercises      General Comments General comments (skin integrity, edema, etc.): decreased sensation R UE > R LE; son present; no c/o dizziness      Pertinent Vitals/Pain Pain Assessment: Faces Faces Pain Scale: Hurts a little bit Pain Location: L posterior head Pain Descriptors / Indicators: Headache Pain Intervention(s): Monitored during session;Premedicated before session;Repositioned    Home Living                      Prior Function            PT Goals (current goals can now be found in the care plan section) Acute Rehab PT Goals Patient Stated Goal:  go home, read and play with dogs Progress towards PT goals: Progressing toward goals    Frequency    Min 4X/week      PT Plan Current plan remains appropriate    Co-evaluation              AM-PAC PT "6 Clicks" Daily Activity  Outcome Measure  Difficulty turning over in bed (including adjusting bedclothes, sheets and blankets)?: Unable Difficulty moving from lying on back to sitting on the side of the bed? : Unable Difficulty sitting down on and standing up from a chair with arms (e.g., wheelchair, bedside commode, etc,.)?:  Unable Help needed moving to and from a bed to chair (including a wheelchair)?: A Lot Help needed walking in hospital room?: A Lot Help needed climbing 3-5 steps with a railing? : Total 6 Click Score: 8    End of Session Equipment Utilized During Treatment: Gait belt Activity Tolerance: Patient tolerated treatment well Patient left: in chair;with family/visitor present;with call bell/phone within reach;with chair alarm set Nurse Communication: Mobility status PT Visit Diagnosis: Other abnormalities of gait and mobility (R26.89);Muscle weakness (generalized) (M62.81);Unsteadiness on feet (R26.81);Hemiplegia and hemiparesis Hemiplegia - Right/Left: Right     Time: 0940-1007 PT Time Calculation (min) (ACUTE ONLY): 27 min  Charges:  $Gait Training: 8-22 mins $Therapeutic Activity: 8-22 mins                     Erline Levine, PTA Pager: [phone]     Carolynne Edouard 02/05/2018, 10:52 AM

## 2018-02-05 NOTE — Progress Notes (Signed)
OT Cancellation Note  Patient Details Name: Brandy Barnett MRN: [id number] DOB: [birth year]   Cancelled Treatment:    Reason Eval/Treat Not Completed: OT screened, no needs identified, will sign off.  Spoke with CIR CM.  Pt will discharge to CIR tomorrow, OT eval not needed for pre certification, will defer OT to CIR.  If plan changes, will initiate acute OT eval.  Jeani Hawking, OTR/L Acute Rehabilitation Services Pager [phone] Office [phone]   Jeani Hawking M 02/05/2018, 2:22 PM

## 2018-02-06 ENCOUNTER — Inpatient Hospital Stay (HOSPITAL_COMMUNITY)
Admission: RE | Admit: 2018-02-06 | Discharge: 2018-02-21 | DRG: 057 | Disposition: A | Discharge status: Skilled Nursing Facility | Payer: Medicare Other | Source: Intra-hospital | Attending: Physical Medicine & Rehabilitation | Admitting: Physical Medicine & Rehabilitation

## 2018-02-06 ENCOUNTER — Encounter (HOSPITAL_COMMUNITY): Payer: Self-pay | Admitting: *Deleted

## 2018-02-06 ENCOUNTER — Inpatient Hospital Stay (HOSPITAL_COMMUNITY): Payer: Medicare Other

## 2018-02-06 DIAGNOSIS — G8929 Other chronic pain: Secondary | ICD-10-CM | POA: Diagnosis present

## 2018-02-06 DIAGNOSIS — I10 Essential (primary) hypertension: Secondary | ICD-10-CM

## 2018-02-06 DIAGNOSIS — Z809 Family history of malignant neoplasm, unspecified: Secondary | ICD-10-CM | POA: Diagnosis not present

## 2018-02-06 DIAGNOSIS — M545 Low back pain, unspecified: Secondary | ICD-10-CM | POA: Diagnosis present

## 2018-02-06 DIAGNOSIS — I639 Cerebral infarction, unspecified: Secondary | ICD-10-CM | POA: Diagnosis present

## 2018-02-06 DIAGNOSIS — I69351 Hemiplegia and hemiparesis following cerebral infarction affecting right dominant side: Principal | ICD-10-CM

## 2018-02-06 DIAGNOSIS — N179 Acute kidney failure, unspecified: Secondary | ICD-10-CM | POA: Diagnosis present

## 2018-02-06 DIAGNOSIS — N184 Chronic kidney disease, stage 4 (severe): Secondary | ICD-10-CM | POA: Diagnosis present

## 2018-02-06 DIAGNOSIS — B9620 Unspecified Escherichia coli [E. coli] as the cause of diseases classified elsewhere: Secondary | ICD-10-CM | POA: Diagnosis not present

## 2018-02-06 DIAGNOSIS — Z961 Presence of intraocular lens: Secondary | ICD-10-CM | POA: Diagnosis present

## 2018-02-06 DIAGNOSIS — D72828 Other elevated white blood cell count: Secondary | ICD-10-CM | POA: Diagnosis not present

## 2018-02-06 DIAGNOSIS — Z23 Encounter for immunization: Secondary | ICD-10-CM

## 2018-02-06 DIAGNOSIS — D509 Iron deficiency anemia, unspecified: Secondary | ICD-10-CM | POA: Diagnosis present

## 2018-02-06 DIAGNOSIS — N390 Urinary tract infection, site not specified: Secondary | ICD-10-CM | POA: Diagnosis not present

## 2018-02-06 DIAGNOSIS — Z833 Family history of diabetes mellitus: Secondary | ICD-10-CM | POA: Diagnosis not present

## 2018-02-06 DIAGNOSIS — E079 Disorder of thyroid, unspecified: Secondary | ICD-10-CM | POA: Diagnosis present

## 2018-02-06 DIAGNOSIS — Z7984 Long term (current) use of oral hypoglycemic drugs: Secondary | ICD-10-CM

## 2018-02-06 DIAGNOSIS — D631 Anemia in chronic kidney disease: Secondary | ICD-10-CM | POA: Diagnosis present

## 2018-02-06 DIAGNOSIS — I129 Hypertensive chronic kidney disease with stage 1 through stage 4 chronic kidney disease, or unspecified chronic kidney disease: Secondary | ICD-10-CM | POA: Diagnosis present

## 2018-02-06 DIAGNOSIS — E785 Hyperlipidemia, unspecified: Secondary | ICD-10-CM

## 2018-02-06 DIAGNOSIS — Z7982 Long term (current) use of aspirin: Secondary | ICD-10-CM | POA: Diagnosis not present

## 2018-02-06 DIAGNOSIS — Z96641 Presence of right artificial hip joint: Secondary | ICD-10-CM | POA: Diagnosis present

## 2018-02-06 DIAGNOSIS — G8191 Hemiplegia, unspecified affecting right dominant side: Secondary | ICD-10-CM | POA: Diagnosis not present

## 2018-02-06 DIAGNOSIS — R52 Pain, unspecified: Secondary | ICD-10-CM

## 2018-02-06 DIAGNOSIS — E1122 Type 2 diabetes mellitus with diabetic chronic kidney disease: Secondary | ICD-10-CM | POA: Diagnosis present

## 2018-02-06 DIAGNOSIS — Z9849 Cataract extraction status, unspecified eye: Secondary | ICD-10-CM | POA: Diagnosis not present

## 2018-02-06 DIAGNOSIS — Z90710 Acquired absence of both cervix and uterus: Secondary | ICD-10-CM

## 2018-02-06 DIAGNOSIS — E119 Type 2 diabetes mellitus without complications: Secondary | ICD-10-CM

## 2018-02-06 DIAGNOSIS — E0822 Diabetes mellitus due to underlying condition with diabetic chronic kidney disease: Secondary | ICD-10-CM | POA: Diagnosis present

## 2018-02-06 LAB — BASIC METABOLIC PANEL
Anion gap: 12 (ref 5–15)
BUN: 29 mg/dL — AB (ref 8–23)
CHLORIDE: 110 mmol/L (ref 98–111)
CO2: 21 mmol/L — ABNORMAL LOW (ref 22–32)
CREATININE: 1.49 mg/dL — AB (ref 0.44–1.00)
Calcium: 8.7 mg/dL — ABNORMAL LOW (ref 8.9–10.3)
GFR calc Af Amer: 36 mL/min — ABNORMAL LOW (ref >60)
GFR calc non Af Amer: 31 mL/min — ABNORMAL LOW (ref >60)
GLUCOSE: 117 mg/dL — AB (ref 70–99)
Potassium: 4.3 mmol/L (ref 3.5–5.1)
SODIUM: 143 mmol/L (ref 135–145)

## 2018-02-06 LAB — GLUCOSE, CAPILLARY
GLUCOSE-CAPILLARY: 126 mg/dL — AB (ref 70–99)
GLUCOSE-CAPILLARY: 145 mg/dL — AB (ref 70–99)
GLUCOSE-CAPILLARY: 141 mg/dL — AB (ref 70–99)
GLUCOSE-CAPILLARY: 106 mg/dL — AB (ref 70–99)

## 2018-02-06 MED ORDER — PROCHLORPERAZINE EDISYLATE 10 MG/2ML IJ SOLN: 5.0000 mg | Freq: Four times a day (QID) | INTRAMUSCULAR | Status: DC | PRN

## 2018-02-06 MED ORDER — FLEET ENEMA 7-19 GM/118ML RE ENEM: 1.0000 | ENEMA | Freq: Once | RECTAL | Status: DC | PRN

## 2018-02-06 MED ORDER — POLYETHYLENE GLYCOL 3350 17 G PO PACK
17.0000 g | PACK | Freq: Every day | ORAL | Status: DC | PRN
  Administered 2018-02-09 – 2018-02-16 (×3): 17 g via ORAL
  Filled 2018-02-06 (×4): qty 1

## 2018-02-06 MED ORDER — AMLODIPINE BESYLATE 10 MG PO TABS
10.0000 mg | ORAL_TABLET | Freq: Every day | ORAL | Status: DC
  Administered 2018-02-07 – 2018-02-19 (×13): 10 mg via ORAL
  Filled 2018-02-06 (×13): qty 1

## 2018-02-06 MED ORDER — GUAIFENESIN-DM 100-10 MG/5ML PO SYRP: 5.0000 mL | ORAL_SOLUTION | Freq: Four times a day (QID) | ORAL | Status: DC | PRN

## 2018-02-06 MED ORDER — DIPHENHYDRAMINE HCL 12.5 MG/5ML PO ELIX: 12.5000 mg | ORAL_SOLUTION | Freq: Four times a day (QID) | ORAL | Status: DC | PRN

## 2018-02-06 MED ORDER — INSULIN ASPART 100 UNIT/ML ~~LOC~~ SOLN: 0.0000 [IU] | Freq: Every day | SUBCUTANEOUS | Status: DC

## 2018-02-06 MED ORDER — TRAMADOL HCL 50 MG PO TABS
50.0000 mg | ORAL_TABLET | Freq: Every day | ORAL | Status: DC | PRN
  Administered 2018-02-13 – 2018-02-16 (×2): 50 mg via ORAL
  Filled 2018-02-06 (×2): qty 1

## 2018-02-06 MED ORDER — ACETAMINOPHEN 325 MG PO TABS
325.0000 mg | ORAL_TABLET | ORAL | Status: DC | PRN
  Administered 2018-02-08 – 2018-02-21 (×8): 650 mg via ORAL
  Filled 2018-02-06 (×6): qty 2
  Filled 2018-02-06: qty 1
  Filled 2018-02-06 (×3): qty 2

## 2018-02-06 MED ORDER — SEVELAMER CARBONATE 800 MG PO TABS
800.0000 mg | ORAL_TABLET | Freq: Every day | ORAL | Status: DC
  Administered 2018-02-07 – 2018-02-21 (×15): 800 mg via ORAL
  Filled 2018-02-06 (×15): qty 1

## 2018-02-06 MED ORDER — ASPIRIN 325 MG PO TABS
325.0000 mg | ORAL_TABLET | Freq: Every day | ORAL | Status: DC
  Administered 2018-02-07 – 2018-02-21 (×15): 325 mg via ORAL
  Filled 2018-02-06 (×15): qty 1

## 2018-02-06 MED ORDER — ASPIRIN 325 MG PO TABS: 325.0000 mg | ORAL_TABLET | Freq: Every day | ORAL | Status: DC

## 2018-02-06 MED ORDER — GLIPIZIDE ER 2.5 MG PO TB24
2.5000 mg | ORAL_TABLET | Freq: Every day | ORAL | Status: DC
  Administered 2018-02-06 – 2018-02-21 (×16): 2.5 mg via ORAL
  Filled 2018-02-06 (×16): qty 1

## 2018-02-06 MED ORDER — ALUM & MAG HYDROXIDE-SIMETH 200-200-20 MG/5ML PO SUSP: 30.0000 mL | ORAL | Status: DC | PRN

## 2018-02-06 MED ORDER — AMLODIPINE BESYLATE 10 MG PO TABS: 10.0000 mg | ORAL_TABLET | Freq: Every day | ORAL | Status: DC

## 2018-02-06 MED ORDER — ALBUTEROL SULFATE (2.5 MG/3ML) 0.083% IN NEBU: 3.0000 mL | INHALATION_SOLUTION | Freq: Four times a day (QID) | RESPIRATORY_TRACT | Status: DC | PRN

## 2018-02-06 MED ORDER — OCUVITE-LUTEIN PO CAPS
1.0000 | ORAL_CAPSULE | Freq: Two times a day (BID) | ORAL | Status: DC
  Filled 2018-02-06: qty 1

## 2018-02-06 MED ORDER — PROCHLORPERAZINE MALEATE 5 MG PO TABS: 5.0000 mg | ORAL_TABLET | Freq: Four times a day (QID) | ORAL | Status: DC | PRN

## 2018-02-06 MED ORDER — ENOXAPARIN SODIUM 40 MG/0.4ML ~~LOC~~ SOLN
40.0000 mg | SUBCUTANEOUS | Status: DC
  Administered 2018-02-06: 40 mg via SUBCUTANEOUS
  Filled 2018-02-06: qty 0.4

## 2018-02-06 MED ORDER — ROSUVASTATIN CALCIUM 10 MG PO TABS
10.0000 mg | ORAL_TABLET | Freq: Every day | ORAL | Status: DC
  Administered 2018-02-06 – 2018-02-20 (×15): 10 mg via ORAL
  Filled 2018-02-06 (×15): qty 1

## 2018-02-06 MED ORDER — VITAMIN C 500 MG PO TABS
1000.0000 mg | ORAL_TABLET | Freq: Every day | ORAL | Status: DC
  Administered 2018-02-06 – 2018-02-21 (×16): 1000 mg via ORAL
  Filled 2018-02-06 (×17): qty 2

## 2018-02-06 MED ORDER — SENNOSIDES-DOCUSATE SODIUM 8.6-50 MG PO TABS
1.0000 | ORAL_TABLET | Freq: Every evening | ORAL | Status: DC | PRN
  Administered 2018-02-12: 1 via ORAL
  Filled 2018-02-06: qty 1

## 2018-02-06 MED ORDER — INSULIN ASPART 100 UNIT/ML ~~LOC~~ SOLN
0.0000 [IU] | Freq: Three times a day (TID) | SUBCUTANEOUS | Status: DC
  Administered 2018-02-06: 2 [IU] via SUBCUTANEOUS
  Administered 2018-02-07 – 2018-02-08 (×2): 1 [IU] via SUBCUTANEOUS
  Administered 2018-02-09: 3 [IU] via SUBCUTANEOUS
  Administered 2018-02-10 – 2018-02-15 (×8): 1 [IU] via SUBCUTANEOUS
  Administered 2018-02-16 – 2018-02-17 (×2): 2 [IU] via SUBCUTANEOUS
  Administered 2018-02-18: 1 [IU] via SUBCUTANEOUS
  Administered 2018-02-19: 2 [IU] via SUBCUTANEOUS
  Administered 2018-02-19: 1 [IU] via SUBCUTANEOUS

## 2018-02-06 MED ORDER — PROSIGHT PO TABS
1.0000 | ORAL_TABLET | Freq: Two times a day (BID) | ORAL | Status: DC
  Administered 2018-02-06 – 2018-02-21 (×30): 1 via ORAL
  Filled 2018-02-06 (×32): qty 1

## 2018-02-06 MED ORDER — BISACODYL 10 MG RE SUPP
10.0000 mg | Freq: Every day | RECTAL | Status: DC | PRN
  Administered 2018-02-12 – 2018-02-17 (×2): 10 mg via RECTAL
  Filled 2018-02-06 (×3): qty 1

## 2018-02-06 MED ORDER — PROCHLORPERAZINE 25 MG RE SUPP: 12.5000 mg | Freq: Four times a day (QID) | RECTAL | Status: DC | PRN

## 2018-02-06 MED ORDER — TRAZODONE HCL 50 MG PO TABS
25.0000 mg | ORAL_TABLET | Freq: Every evening | ORAL | Status: DC | PRN
  Administered 2018-02-10 – 2018-02-20 (×12): 50 mg via ORAL
  Filled 2018-02-06 (×12): qty 1

## 2018-02-06 NOTE — Progress Notes (Signed)
PMR Admission Coordinator Pre-Admission Assessment  Patient: Brandy Barnett is an 84 y.o., female MRN: [id number] DOB: 1932-11-21 Height: '5\' 2"'$  (157.5 cm) Weight: 65.8 kg                                                                                                                                                  Insurance Information HMO:     PPO:      PCP:      IPA:      80/20: Yes     OTHER:  PRIMARY: medicare part A and B      Policy#: [id number]      Subscriber: Patient CM Name:       Phone#:      Fax#:  Pre-Cert#:       Employer:  Benefits:  Phone #: NA     Name: Verified eligibility on 02/05/18 via [employer] Eff. Date: Part A effective 10/05/1999; Part B effective on 10/05/1999      Deduct: $1,364      Out of Pocket Max: NA      Life Max: NA CIR: Covered per Medicare Guidelines once yearly deductible is met      SNF: days 1-20, 100%; days 21-100, 80% Outpatient: 80%     Co-Pay: 20% Home Health: 100%      Co-Pay:  DME: 80%     Co-Pay: 20% Providers: Pt's Choice SECONDARY: BCBS       Policy#: [id number]      Subscriber: Patient CM Name:       Phone#:      Fax#:  Pre-Cert#:       Employer:  Benefits:  Phone #: [phone]     Name:  Eff. Date:      Deduct:       Out of Pocket Max:       Life Max:  CIR:       SNF:  Outpatient:      Co-Pay:  Home Health:       Co-Pay:  DME:      Co-Pay:   Medicaid Application Date:       Case Manager:  Disability Application Date:       Case Worker:   Emergency Contact Information         Contact Information    Name Relation Home Work Mobile   Tokarczyk,Scott Son [phone]     Tulsi, [place] [phone]       Current Medical History  Patient Admitting Diagnosis: Small subcortical infarct with residualright hemiparesis upper extremity groin lower extremity.  History of Present Illness: Brandy Bucholz Priceis a 84 y.o.femaleT2DM with CKD stage IV/V, HTN, anemia;who was admitted on 01/24/2019 transient loss of consciousness with  weakness right side, fall and inability to walk. CT of head negative for bleed. TPA administered.CTA head neck was negative for  large vessel occlusion or significant stenosis. MRI/MRA brain done showing no acute intracranial process,mild to moderate chronic small vessel changes and advanced brainstem atrophy and basal ganglia mineralizationwith question of multisystem atrophy or other neurodegenerative syndromes.2D echo done showing EF of 60-65%,no wall abnormality and grade 1 diastolic dysfunction.Dr. Leonie Man felt that patient with small subcortical stroke not seen on x ray.Therapy evaluations done revealing deficits due to elevated BP, weakness and decreased coordination. CIR recommended for follow up therapy.  Complete NIHSS TOTAL: 2  Past Medical History      Past Medical History:  Diagnosis Date  . Chronic kidney disease   . Diabetes mellitus   . Hypertension   . Iron deficiency anemia   . Low back pain   . Stroke ([place])   . Thyroid disease     Family History  family history includes Cancer in her father and mother; Diabetes in her brother and mother.  Prior Rehab/Hospitalizations:  Has the patient had major surgery during 100 days prior to admission? No  Current Medications   Current Facility-Administered Medications:  .  0.9 %  sodium chloride infusion, , Intravenous, Continuous, Amie Portland, MD, Last Rate: 75 mL/hr at 02/05/18 2244 .  acetaminophen (TYLENOL) tablet 650 mg, 650 mg, Oral, Q4H PRN, 650 mg at 02/05/18 1528 **OR** acetaminophen (TYLENOL) solution 650 mg, 650 mg, Per Tube, Q4H PRN **OR** acetaminophen (TYLENOL) suppository 650 mg, 650 mg, Rectal, Q4H PRN, Amie Portland, MD .  amLODipine (NORVASC) tablet 10 mg, 10 mg, Oral, Daily, Rinehuls, David L, PA-C, 10 mg at 02/05/18 1123 .  aspirin tablet 325 mg, 325 mg, Oral, Daily, Amie Portland, MD, 325 mg at 02/05/18 1122 .  clevidipine (CLEVIPREX) infusion 0.5 mg/mL, 0-21 mg/hr, Intravenous,  Continuous, Amie Portland, MD, Stopped at 02/04/18 [phone] .  insulin aspart (novoLOG) injection 0-9 Units, 0-9 Units, Subcutaneous, TID WC, Rinehuls, Early Chars, PA-C, 1 Units at 02/06/18 0811 .  labetalol (NORMODYNE,TRANDATE) injection 10 mg, 10 mg, Intravenous, Q4H PRN, Leonie Man, Pramod S, MD .  rosuvastatin (CRESTOR) tablet 10 mg, 10 mg, Oral, q1800, Rinehuls, David L, PA-C, 10 mg at 02/05/18 1729 .  senna-docusate (Senokot-S) tablet 1 tablet, 1 tablet, Oral, QHS PRN, Amie Portland, MD  Patients Current Diet:     Diet Order                  Diet heart healthy/carb modified Room service appropriate? Yes; Fluid consistency: Thin  Diet effective now               Precautions / Restrictions Precautions Precautions: Fall Precaution Comments: pt reports several falls Restrictions Weight Bearing Restrictions: No   Has the patient had 2 or more falls or a fall with injury in the past year?Yes  Prior Activity Level Community (5-7x/wk): walk around yard; kept care of her Advertising account executive / [place] Devices/Equipment: Built-in shower seat Home Equipment: Shower seat - built in, [place] - 2 wheels, [place] - single point  Prior Device Use: Indicate devices/aids used by the patient prior to current illness, exacerbation or injury? None of the above  Prior Functional Level Prior Function Level of Independence: Independent  Self Care: Did the patient need help bathing, dressing, using the toilet or eating?  Independent  Indoor Mobility: Did the patient need assistance with walking from room to room (with or without device)? Independent  Stairs: Did the patient need assistance with internal or external stairs (with or without device)? Independent  Functional Cognition: Did the  patient need help planning regular tasks such as shopping or remembering to take medications? Independent  Current Functional Level Cognition  Overall Cognitive Status:  Within Functional Limits for tasks assessed Orientation Level: Oriented X4    Extremity Assessment (includes Sensation/Coordination)  Upper Extremity Assessment: RUE deficits/detail RUE Deficits / Details: grossly 2-/5 defer to OT for full eval  Lower Extremity Assessment: RLE deficits/detail RLE Deficits / Details: 3/5 hip flexion, 4/5 knee flexion and extension as well as dorsiflexion/plantarflexion, Hip ABD/Add 3+/5, decreased dorsiflexion PROM RLE Sensation: decreased light touch(decreased sensation mid thigh) RLE Coordination: decreased gross motor(lack of coordination for alternating and same direction toe tapping. RLE slower than LLE)    ADLs       Mobility  Overal bed mobility: Needs Assistance Bed Mobility: Rolling, Sidelying to Sit Rolling: Min guard Sidelying to sit: Mod assist General bed mobility comments: assist to bring hips to EOB and to elevate trunk into sitting; use of rail    Transfers  Overall transfer level: Needs assistance Equipment used: 2 person hand held assist Transfers: Sit to/from Stand Sit to Stand: Min assist Stand pivot transfers: Mod assist, +2 physical assistance General transfer comment: assist to power up and for balance upon standing due to R side weakness    Ambulation / Gait / Stairs / Wheelchair Mobility  Ambulation/Gait Ambulation/Gait assistance: Mod assist, +2 physical assistance Gait Distance (Feet): 12 Feet Assistive device: 2 person hand held assist Gait Pattern/deviations: Step-to pattern, Decreased step length - left, Decreased stance time - right, Decreased dorsiflexion - right, Decreased weight shift to right, Ataxic, Narrow base of support General Gait Details: pt requires verbal and visual cues for R foot placement and mutlimodal cues for posture, weight shifting, and advancing R LE; R side genu recurvatum    Posture / Balance Balance Overall balance assessment: Needs assistance Sitting balance-Leahy Scale:  Fair Standing balance support: Bilateral upper extremity supported Standing balance-Leahy Scale: Poor    Special needs/care consideration BiPAP/CPAP: no CPM: no Continuous Drip IV: no Dialysis: no        Days: no Life Vest: no Oxygen: no Special Bed: no Trach Size: no Wound Vac (area): no      Location: No Skin: ecchymosis on posterior head                               Bowel mgmt:Last BM: 02/05/18, continent Bladder mgmt: external urinary catheter Diabetic mgmt: yes     Previous Home Environment Living Arrangements: Children  Lives With: Son Available Help at Discharge: Family Type of Home: House Home Layout: One level Home Access: Stairs to enter Entrance Stairs-Rails: Right, Left(cannot reach both at same time. ) Entrance Stairs-Number of Steps: 3 Bathroom Shower/Tub: Multimedia programmer: Handicapped height Home Care Services: No  Discharge Living Setting Plans for Discharge Living Setting: Patient's home, Lives with (comment)(son and daugther in law) Type of Home at Discharge: House Discharge Home Layout: Two level, Able to live on main level with bedroom/bathroom Alternate Level Stairs-Rails: None(unknown as pt lives on 1st floor) Discharge Home Access: Stairs to enter Entrance Stairs-Rails: None Entrance Stairs-Number of Steps: 2 Discharge Bathroom Shower/Tub: Walk-in shower(with built in Astronomer) Discharge Bathroom Toilet: Handicapped height Discharge Bathroom Accessibility: Yes How Accessible: Accessible via walker Does the patient have any problems obtaining your medications?: No  Social/Family/Support Systems Patient Roles: Other (Comment)(lives with son; takes care of her dogs) Contact Information: son is emergency contact Anticipated Caregiver:  son Abe People) [phone] Anticipated Caregiver's Contact Information: see above Ability/Limitations of Caregiver: intermittant to close to 24/7 supervision between son and pt's daughter in  law(supervision only due to health concerns from son) Caregiver Availability: Intermittent Discharge Plan Discussed with Primary Caregiver: Yes Is Caregiver In Agreement with Plan?: Yes Does Caregiver/Family have Issues with Lodging/Transportation while Pt is in Rehab?: No   Goals/Additional Needs Patient/Family Goal for Rehab: PT/OT: Mod I/Supervision Expected length of stay: 8-12 days Cultural Considerations: [employer] Dietary Needs: heart healthy; carb modified; thin liquids Equipment Needs: TBD Pt/Family Agrees to Admission and willing to participate: Yes Program Orientation Provided & Reviewed with Pt/Caregiver Including Roles  & Responsibilities: Yes(pt and son)  Barriers to Discharge: Home environment access/layout, Decreased caregiver support  Barriers to Discharge Comments: 2 steps to enter home (through garage) no rails   Decrease burden of Care through IP rehab admission: NA   Possible need for SNF placement upon discharge: not anticipated; pt has good prognosis for further progress with good social support at home.    Patient Condition: This patient's condition remains as documented in the consult dated 02/05/18, in which the Rehabilitation Physician determined and documented that the patient's condition is appropriate for intensive rehabilitative care in an inpatient rehabilitation facility. Will admit to inpatient rehab today.  Preadmission Screen Completed By:  Jhonnie Garner, 02/06/2018 9:59 AM ______________________________________________________________________   Discussed status with Dr. Posey Pronto on 02/06/18 at 10:00AM and received telephone approval for admission today.  Admission Coordinator:  Jhonnie Garner, time 10:00AM/Date 02/06/18           Cosigned by: Jamse Arn, MD at 02/06/2018 10:26 AM  Revision History

## 2018-02-06 NOTE — H&P (Signed)
Physical Medicine and Rehabilitation Admission H&P    Chief Complaint  Patient presents with  . Functional decline due to stroke.     HPI: Brandy Barnett is an 84 year old RH female with history of T2DM with CKD, hypertension, anemia; who was admitted on 01/23/2018 with transient loss of consciousness with right-sided weakness, fall and inability to walk.  History taken from chart review and patient. CT of head negative and TPA administered.  CTA head neck was negative for large vessel occlusion or significant stenosis.  MRI brain reviewed, unremarkable for acute intracranial process. Per report, and MRA brain showed no acute intracranial process, mild to moderate chronic small vessel changes and advanced brainstem atrophy as well as basal ganglia mineralization.  2D echo done revealing EF of 60 to 53%, grade 1 diastolic dysfunction and no wall abnormality.  Dr. Leonie Man felt that patient had a small subcortical stroke not seen on MRI and patient to continue with aspirin.  Therapy evaluations done revealing deficits due to elevated blood pressure, weakness and decreased coordination.  CIR recommended for follow-up therapy   Review of Systems  Constitutional: Negative for chills and fever.  HENT: Positive for hearing loss (decreased hearing). Negative for tinnitus.   Eyes: Negative for blurred vision and double vision.  Respiratory: Negative for cough and shortness of breath.   Cardiovascular: Negative for chest pain.  Gastrointestinal: Negative for constipation, heartburn and nausea.  Genitourinary: Negative for dysuria and urgency.  Musculoskeletal: Positive for joint pain (right third finger pain since fall) and myalgias (with increase in activity).  Neurological: Positive for sensory change (neuropathy BLE), focal weakness and headaches (on and off since stroke). Negative for dizziness and speech change.  Psychiatric/Behavioral: The patient has insomnia (poor sleep at baseline. ).   All other  systems reviewed and are negative.   Past Medical History:  Diagnosis Date  . Chronic kidney disease   . Diabetes mellitus   . Hypertension   . Iron deficiency anemia   . Low back pain   . Stroke ([place])   . Thyroid disease     Past Surgical History:  Procedure Laterality Date  . CATARACT EXTRACTION     with lens implant for mono vision  . TOTAL ABDOMINAL HYSTERECTOMY    . TOTAL HIP ARTHROPLASTY Right 1990's    Family History  Problem Relation Age of Onset  . Diabetes Mother   . Cancer Mother   . Cancer Father   . Diabetes Brother     Social History:  Lives with son and daughter-in-law.  Son retired and they have Environmental consultant with home Mudlogger.  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  She reports that she does not drink alcohol, use tobacco or illicit drugs.   Allergies: No Known Allergies    Medications Prior to Admission  Medication Sig Dispense Refill  . acetaminophen (TYLENOL) 500 MG tablet Take 1,000 mg by mouth every 6 (six) hours as needed for headache (pain).    Marland Kitchen albuterol (PROVENTIL HFA;VENTOLIN HFA) 108 (90 Base) MCG/ACT inhaler Inhale 2 puffs into the lungs every 6 (six) hours as needed for wheezing or shortness of breath.    . Ascorbic Acid (VITAMIN C) 1000 MG tablet Take 1,000 mg by mouth daily.    Marland Kitchen aspirin 81 MG EC tablet Take 81 mg by mouth daily.      . DULoxetine (CYMBALTA) 30 MG capsule Take 30 mg by mouth daily.  3  .  glipiZIDE (GLUCOTROL XL) 2.5 MG 24 hr tablet Take 2.5 mg by mouth daily.  1  . hydrochlorothiazide (HYDRODIURIL) 25 MG tablet Take 12.5 mg by mouth every Monday, Wednesday, and Friday.  6  . Multiple Vitamins-Minerals (PRESERVISION AREDS 2) CAPS Take 1 capsule by mouth 2 (two) times daily.    . rosuvastatin (CRESTOR) 10 MG tablet Take 10 mg by mouth every evening.  3  . sevelamer carbonate (RENVELA) 800 MG tablet Take 800 mg by mouth daily with lunch.  0  . traMADol  (ULTRAM) 50 MG tablet Take 50 mg by mouth daily as needed (pain).   0    Drug Regimen Review  Drug regimen was reviewed and remains appropriate with no significant issues identified  Home: Home Living Family/patient expects to be discharged to:: Private residence Living Arrangements: Children Available Help at Discharge: Family Type of Home: House Home Access: Stairs to enter Technical brewer of Steps: 3 Entrance Stairs-Rails: Right, Left(cannot reach both at same time. ) Home Layout: One level Bathroom Shower/Tub: Multimedia programmer: Handicapped height Home Equipment: [place] in, Environmental consultant - 2 wheels, Radio producer - single point  Lives With: Son   Functional History: Prior Function Level of Independence: Independent  Functional Status:  Mobility: Bed Mobility Overal bed mobility: Needs Assistance Bed Mobility: Rolling, Sidelying to Sit Rolling: Min guard Sidelying to sit: Mod assist General bed mobility comments: assist to bring hips to EOB and to elevate trunk into sitting; use of rail Transfers Overall transfer level: Needs assistance Equipment used: 2 person hand held assist Transfers: Sit to/from Stand Sit to Stand: Min assist Stand pivot transfers: Mod assist, +2 physical assistance General transfer comment: assist to power up and for balance upon standing due to R side weakness Ambulation/Gait Ambulation/Gait assistance: Mod assist, +2 physical assistance Gait Distance (Feet): 12 Feet Assistive device: 2 person hand held assist Gait Pattern/deviations: Step-to pattern, Decreased step length - left, Decreased stance time - right, Decreased dorsiflexion - right, Decreased weight shift to right, Ataxic, Narrow base of support General Gait Details: pt requires verbal and visual cues for R foot placement and mutlimodal cues for posture, weight shifting, and advancing R LE; R side genu recurvatum    ADL:    Cognition: Cognition Overall  Cognitive Status: Within Functional Limits for tasks assessed Orientation Level: Oriented X4 Cognition Arousal/Alertness: Awake/alert Behavior During Therapy: WFL for tasks assessed/performed Overall Cognitive Status: Within Functional Limits for tasks assessed  Physical Exam: Blood pressure (!) 183/92, pulse 90, temperature 98.1 F (36.7 C), temperature source Oral, resp. rate 18, height _0  (1.575 m), weight 65.8 kg, SpO2 96 %. Physical Exam  Vitals reviewed. Constitutional: She is oriented to person, place, and time. She appears well-developed and well-nourished.  HENT:  Head: Normocephalic and atraumatic.  Eyes: EOM are normal. Right eye exhibits no discharge. Left eye exhibits no discharge.  Neck: Normal range of motion. Neck supple.  Cardiovascular: Normal rate and regular rhythm.  Respiratory: Effort normal and breath sounds normal.  GI: Soft. Bowel sounds are normal.  Musculoskeletal:  Mild LE edema.  Right third finger with pain and mild deformity. Noted to have increased edema compared to other digits.   Neurological: She is alert and oriented to person, place, and time.  Motor: LUE/LLE: 5/5 proximal to distal RUE: 3-/5 proximal to distal RLE: 4+/5 proximal to distal Sensation diminished to light touch RUE  Skin: Skin is warm and dry.  Psychiatric: She has a normal mood and affect.  Her behavior is normal.    Results for orders placed or performed during the hospital encounter of 02/02/18 (from the past 48 hour(s))  Glucose, capillary     Status: Abnormal   Collection Time: 02/04/18 12:39 PM  Result Value Ref Range   Glucose-Capillary 126 (H) 70 - 99 mg/dL  Glucose, capillary     Status: Abnormal   Collection Time: 02/04/18  5:03 PM  Result Value Ref Range   Glucose-Capillary 129 (H) 70 - 99 mg/dL  Glucose, capillary     Status: Abnormal   Collection Time: 02/04/18  9:51 PM  Result Value Ref Range   Glucose-Capillary 141 (H) 70 - 99 mg/dL   Comment 1 Notify RN     Comment 2 Document in Chart   Basic metabolic panel     Status: Abnormal   Collection Time: 02/05/18  3:03 AM  Result Value Ref Range   Sodium 143 135 - 145 mmol/L   Potassium 3.8 3.5 - 5.1 mmol/L   Chloride 112 (H) 98 - 111 mmol/L   CO2 22 22 - 32 mmol/L   Glucose, Bld 102 (H) 70 - 99 mg/dL   BUN 29 (H) 8 - 23 mg/dL   Creatinine, Ser 1.43 (H) 0.44 - 1.00 mg/dL   Calcium 8.7 (L) 8.9 - 10.3 mg/dL   GFR calc non Af Amer 33 (L) >60 mL/min   GFR calc Af Amer 38 (L) >60 mL/min    Comment: (NOTE) The eGFR has been calculated using the CKD EPI equation. This calculation has not been validated in all clinical situations. eGFR's persistently <60 mL/min signify possible Chronic Kidney Disease.    Anion gap 9 5 - 15    Comment: Performed at [facility] [address] [place], [place] [address]  Glucose, capillary     Status: Abnormal   Collection Time: 02/05/18  6:13 AM  Result Value Ref Range   Glucose-Capillary 107 (H) 70 - 99 mg/dL   Comment 1 Notify RN    Comment 2 Document in Chart   Glucose, capillary     Status: Abnormal   Collection Time: 02/05/18 11:12 AM  Result Value Ref Range   Glucose-Capillary 277 (H) 70 - 99 mg/dL  Glucose, capillary     Status: None   Collection Time: 02/05/18  4:43 PM  Result Value Ref Range   Glucose-Capillary 78 70 - 99 mg/dL  Glucose, capillary     Status: Abnormal   Collection Time: 02/05/18  9:29 PM  Result Value Ref Range   Glucose-Capillary 118 (H) 70 - 99 mg/dL   Comment 1 Notify RN    Comment 2 Document in Chart   Basic metabolic panel     Status: Abnormal   Collection Time: 02/06/18  5:21 AM  Result Value Ref Range   Sodium 143 135 - 145 mmol/L   Potassium 4.3 3.5 - 5.1 mmol/L   Chloride 110 98 - 111 mmol/L   CO2 21 (L) 22 - 32 mmol/L   Glucose, Bld 117 (H) 70 - 99 mg/dL   BUN 29 (H) 8 - 23 mg/dL   Creatinine, Ser 1.49 (H) 0.44 - 1.00 mg/dL   Calcium 8.7 (L) 8.9 - 10.3 mg/dL   GFR calc non Af Amer 31 (L) >60 mL/min    GFR calc Af Amer 36 (L) >60 mL/min    Comment: (NOTE) The eGFR has been calculated using the CKD EPI equation. This calculation has not been validated in all clinical situations.  eGFR's persistently <60 mL/min signify possible Chronic Kidney Disease.    Anion gap 12 5 - 15    Comment: Performed at [facility] [address], [place], [place] [address]  Glucose, capillary     Status: Abnormal   Collection Time: 02/06/18  6:19 AM  Result Value Ref Range   Glucose-Capillary 126 (H) 70 - 99 mg/dL   Comment 1 Notify RN    Comment 2 Document in Chart    No results found.     Medical Problem List and Plan: 1. Functional deficits due to RUE>RLE weakness secondary to small subcortical infarct 2.  DVT Prophylaxis/Anticoagulation: Pharmaceutical: Lovenox 3. Pain Management: Tylenol prn for HA. Tramadol prn for moderate to severe pain.  4. Mood: LCSW to follow for evaluation and and support 5. Neuropsych: This patient is capable of making decisions on her own behalf. 6. Skin/Wound Care: Routine pressure relief measures 7. Fluids/Electrolytes/Nutrition: Monitor I's and O's.  Check lytes in a.m. 8.  HTN: Monitor: Blood pressures twice daily--has been labile. Avoid hypotension to allow for adequate perfusion. Will continue Norvasc daily 9.  T2DM: We will monitor blood sugars before meals at bedtime.  Resume home dose Glucotrol. 10.  Chronic low back pain: Resume Cymbalta.  Question tramadol as needed for pain 11.  Iron deficiency anemia: Recheck CBC 12.  CKD stage IV: Has improved with hydration --- DC IV fluids as intake has been good.  BUN/SCr 52/1.7 at admission.    Post Admission Physician Evaluation: 1. Preadmission assessment reviewed and changes made below. 2. Functional deficits secondary  to small subcortical infarct with right hemiparesis. 3. Patient is admitted to receive collaborative, interdisciplinary care between the physiatrist, rehab nursing staff, and therapy  team. 4. Patient's level of medical complexity and substantial therapy needs in context of that medical necessity cannot be provided at a lesser intensity of care such as a SNF. 5. Patient has experienced substantial functional loss from his/her baseline which was documented above under the "Functional History" and "Functional Status" headings.  Judging by the patient's diagnosis, physical exam, and functional history, the patient has potential for functional progress which will result in measurable gains while on inpatient rehab.  These gains will be of substantial and practical use upon discharge  in facilitating mobility and self-care at the household level. 73. Physiatrist will provide 24 hour management of medical needs as well as oversight of the therapy plan/treatment and provide guidance as appropriate regarding the interaction of the two. 7. 24 hour rehab nursing will assist with safety, disease management and patient education  and help integrate therapy concepts, techniques,education, etc. 8. PT will assess and treat for/with: Lower extremity strength, range of motion, stamina, balance, functional mobility, safety, adaptive techniques and equipment, coping skills, pain control, stroke education. Goals are: Supervision/Min A. 9. OT will assess and treat for/with: ADL's, functional mobility, safety, upper extremity strength, adaptive techniques and equipment, wound mgt, ego support, and community reintegration.   Goals are: Supervision/Min A. Therapy may proceed with showering this patient. 10. Case Management and Social Worker will assess and treat for psychological issues and discharge planning. 11. Team conference will be held weekly to assess progress toward goals and to determine barriers to discharge. 12. Patient will receive at least 3 hours of therapy per day at least 5 days per week. 13. ELOS: 11-16 days.       14. Prognosis:  good  I have personally performed a face to face diagnostic  evaluation, including, but  not limited to relevant history and physical exam findings, of this patient and developed relevant assessment and plan.  Additionally, I have reviewed and concur with the physician assistant's documentation above.   The patient's status has not changed. The original post admission physician evaluation remains appropriate, and any changes from the pre-admission screening or documentation from the acute chart are noted above.   Delice Lesch, MD, ABPMR Bary Leriche, PA-C 02/06/2018

## 2018-02-06 NOTE — Progress Notes (Signed)
Inpatient Rehabilitation-Admissions Coordinator   [facility] has received medical approval for admit to CIR today. AC has updated pt on plan and notified floor RN, CM/SW.   Please call if questions.   Nanine Means, OTR/L  Rehab Admissions Coordinator  [phone] 02/06/2018 10:01 AM

## 2018-02-06 NOTE — Progress Notes (Signed)
Orthopedic Tech Progress Note Patient Details:  Brandy Barnett 1932/12/16 [id number]  Patient ID: Brandy Barnett, female   DOB: 1933-01-08, 84 y.o.   MRN: [id number]   Saul Fordyce 02/06/2018, 3:23 PMCalled [facility] for right hand splint.

## 2018-02-06 NOTE — Progress Notes (Signed)
Patient received at 1411 via bed from 3W. Patient alert and oriented x 4 . No complaint of pain. Oriented patient to room and call bell system. Patient verbalized understanding of admission process. Continue with plan of care.  Brandy Barnett

## 2018-02-06 NOTE — Discharge Summary (Addendum)
Stroke Discharge Summary  Patient ID: Brandy Barnett   MRN: [id number]      DOB: 09/11/1932  Date of Admission: 02/02/2018 Date of Discharge: 02/06/2018  Attending Physician:  Micki Riley, MD, Stroke MD Consultant(s):   Claudette Laws, MD (Physical Medicine & Rehabtilitation)  Patient's PCP:  No primary care provider on file.  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 Chronic kidney disease   . Diabetes mellitus   . Hypertension   . Iron deficiency anemia   . Low back pain   . Stroke (HCC)   . Thyroid disease    Past Surgical History:  Procedure Laterality Date  . CATARACT EXTRACTION     with lens implant for mono vision  . TOTAL ABDOMINAL HYSTERECTOMY    . TOTAL HIP ARTHROPLASTY Right 1990's    Medications to be continued on Rehab Allergies as of 02/06/2018   No Known Allergies     Medication List    STOP taking these medications   aspirin 81 MG EC tablet Replaced by:  aspirin 325 MG tablet   hydrochlorothiazide 25 MG tablet Commonly known as:  HYDRODIURIL     TAKE these medications   acetaminophen 500 MG tablet Commonly known as:  TYLENOL Take 1,000 mg by mouth every 6 (six) hours as needed for headache (pain).   albuterol 108 (90 Base) MCG/ACT inhaler Commonly known as:  PROVENTIL HFA;VENTOLIN HFA Inhale 2 puffs into the lungs every 6 (six) hours as needed for wheezing or shortness of breath.   amLODipine 10 MG tablet Commonly known as:  NORVASC Take 1 tablet (10  mg total) by mouth daily.   aspirin 325 MG tablet Take 1 tablet (325 mg total) by mouth daily. Replaces:  aspirin 81 MG EC tablet   DULoxetine 30 MG capsule Commonly known as:  CYMBALTA Take 30 mg by mouth daily.   glipiZIDE 2.5 MG 24 hr tablet Commonly known as:  GLUCOTROL XL Take 2.5 mg by mouth daily.   PRESERVISION AREDS 2 Caps Take 1 capsule by mouth 2 (two) times daily.   rosuvastatin 10 MG tablet Commonly known as:  CRESTOR Take 10 mg by mouth every evening.   sevelamer carbonate 800 MG tablet Commonly known as:  RENVELA Take 800 mg by mouth daily with lunch.   traMADol 50 MG tablet Commonly known as:  ULTRAM Take 50 mg by mouth daily as needed (pain).   vitamin C 1000 MG tablet Take 1,000 mg by mouth daily.      LABORATORY STUDIES CBC    Component Value Date/Time   WBC 10.5 02/04/2018 0310   RBC 3.91 02/04/2018 0310   HGB 11.7 (L) 02/04/2018 0310   HCT 35.7 (L) 02/04/2018 0310   PLT 191 02/04/2018 0310   MCV 91.3 02/04/2018 0310   MCH 29.9 02/04/2018 0310   MCHC 32.8 02/04/2018 0310   RDW 12.5 02/04/2018 0310   LYMPHSABS 1.5 02/02/2018 2129   MONOABS 0.5 02/02/2018 2129   EOSABS 0.2 02/02/2018  2129   BASOSABS 0.1 02/02/2018 2129   CMP    Component Value Date/Time   NA 143 02/06/2018 0521   K 4.3 02/06/2018 0521   CL 110 02/06/2018 0521   CO2 21 (L) 02/06/2018 0521   GLUCOSE 117 (H) 02/06/2018 0521   BUN 29 (H) 02/06/2018 0521   CREATININE 1.49 (H) 02/06/2018 0521   CALCIUM 8.7 (L) 02/06/2018 0521   GFRNONAA 31 (L) 02/06/2018 0521   GFRAA 36 (L) 02/06/2018 0521   COAGS Lab Results  Component Value Date   INR 1.04 02/02/2018   Lipid Panel    Component Value Date/Time   CHOL 83 02/03/2018 0323   TRIG 58 02/03/2018 0323   HDL 37 (L) 02/03/2018 0323   CHOLHDL 2.2 02/03/2018 0323   VLDL 12 02/03/2018 0323   LDLCALC 34 02/03/2018 0323   HgbA1C  Lab Results  Component Value Date   HGBA1C 6.0 (H) 02/03/2018   Urinalysis     Component Value Date/Time   COLORURINE YELLOW 12/12/2010 1352   APPEARANCEUR CLEAR 12/12/2010 1352   LABSPEC 1.025 12/12/2010 1352   PHURINE 5.0 12/12/2010 1352   GLUCOSEU NEGATIVE 12/12/2010 1352   HGBUR NEGATIVE 12/12/2010 1352   BILIRUBINUR NEGATIVE 12/12/2010 1352   KETONESUR NEGATIVE 12/12/2010 1352   PROTEINUR NEGATIVE 12/12/2010 1352   UROBILINOGEN 0.2 12/12/2010 1352   NITRITE NEGATIVE 12/12/2010 1352   LEUKOCYTESUR MODERATE (A) 12/12/2010 1352   Urine Drug Screen No results found for: LABOPIA, COCAINSCRNUR, LABBENZ, AMPHETMU, THCU, LABBARB  Alcohol Level No results found for: [facility]   SIGNIFICANT DIAGNOSTIC STUDIES Ct Head Code Stroke Wo Contrast 02/02/2018 1. No intracranial hemorrhage. Areas of hyperdensity at the midbrain are slightly increased compared to 12/12/2010 and likely due to a degree of parenchymal mineralization.  2. ASPECTS is 10.   Ct Angio Head W Or Wo Contrast Ct Angio Neck W Or Wo Contrast 02/02/2018 1. Negative CTA for large vessel occlusion. No hemodynamically significant or correctable stenosis identified.  2. Mild atherosclerotic change about the carotid bifurcations and carotid siphons for patient age.  3. Diffuse vascular tortuosity, suggesting chronic underlying hypertension.   Ct C-spine No Charge 02/02/2018 1. No acute fracture or static subluxation of the cervical spine.  2. Severe multilevel facet arthrosis.   Dg Chest Port 1 View 02/02/2018 1. Cardiomegaly and chronic interstitial changes.  2. Moderate hiatal hernia.  3.  Aortic Atherosclerosis (ICD10-I70.0).   Transthoracic Echocardiogram - Left ventricle: The cavity size was normal. Systolic function was normal. The estimated ejection fraction was in the range of 60% to 65%. Wall motion was normal; there were no regional wall motion abnormalities. Doppler parameters are consistent with abnormal left ventricular relaxation (grade 1 diastolic dysfunction). - Pulmonary arteries:  Systolic pressure was mildly increased. PA peak pressure: 39 mm Hg (S).     HISTORY OF PRESENT ILLNESS Brandy Barnett is a 84 y.o. female past medical history of diabetes, hypertension, stroke in the past with no residual weakness, thyroid disease, last seen normal at 6 PM on 02/02/2018, when she had a sudden onset of transient loss of consciousness followed by weakness of the right side of her body. She was feeding her dogs at the time and all of a sudden lost consciousness and the next thing she remembers that she was on the floor.  Upon try to get up she could not move her right arm and her leg also felt weak on the right side.  No left-sided symptoms. EMS was called, she is  brought into the emergency room and evaluated by the ER. After ER evaluation, a code stroke was activated for possible acute stroke. Patient was examined in the CT scanner as she was getting a noncontrast head CT. She denied any tingling or numbness but reported weakness only. She denied any headaches visual symptoms or neck pain. She denies any chest pain palpitations shortness of breath nausea vomiting at this time. She was administered IV tPA and admitted for further evaluation and treatment. Premorbid modified Rankin scale (mRS): 2.   HOSPITAL COURSE Ms. Brandy Barnett is a 84 y.o. female with history of previous stroke without residual deficits, hypertension, orthostatic hypotension, diabetes mellitus, and thyroid disease presenting with transient loss of consciousness and right-sided weakness s/p IV tPA Friday 02/02/2018 at 21:15.  She tolerated TPA without difficulty.  A small left subcortical stroke is suspected as symptoms did not resolve, likely too small to be seen on MRI.  Therapy recommended inpatient rehab.  She was discharged there on aspirin 325 mg daily.  Stroke:  Small left subcortical infarct not visible on MRI s/p IV tPA  CT head - No intracranial hemorrhage.  MRI head - no acute infarct  CTA H&N - Negative CTA  for large vessel occlusion.  Carotid Doppler - CTA neck performed - carotid dopplers not indicated.  2D Echo EF 60-65%. No source of embolus   LDL - 34  HgbA1c - 6  aspirin 81 mg daily prior to admission, now on aspirin 325 mg daily. Continue at d/c  Therapy recommendations:  CIR  Disposition:  CIR  Hypertension  Treated with Cleviprex drip post tPA  Started on norvasc 10, stopped home HCTZ 25 d/t renal insufficiency  BP goal normotensive at d/c  Hyperlipidemia  Lipid lowering medication PTA:  Crestor 10 mg daily  LDL 34, goal < 70  Current lipid lowering medication: resume Crestor 10 mg daily (per pharmacy recommendation)  Continue statin at discharge  Diabetes  HgbA1c 6, goal < 7.0  Controlled  Other Stroke Risk Factors  Advanced age  Hx stroke/TIA  Other Active Problems  Renal insufficiency 1.49 at d/c  Mild anemia 11.7  Hypokalemia, resolved 4.3   DISCHARGE EXAM Blood pressure (!) 166/86, pulse 88, temperature 98 F (36.7 C), temperature source Oral, resp. rate 18, height 5\' 2"  (1.575 m), weight 65.8 kg, SpO2 95 %. Pleasant elderly lady currently not in distress. . Afebrile. Head is nontraumatic. Neck is supple without bruit.    Cardiac exam no murmur or gallop. Lungs are clear to auscultation. Distal pulses are well felt. Neurological Exam ;  Awake  Alert oriented x 3. Normal speech and language. Eye movements full without nystagmus. Fundi were not visualized. Vision acuity and fields appear normal. Hearing is normal. Palatal movements are normal. Face  symmetric. Tongue midline. Normal strength, tone, reflexes and coordination except mild diminished fine finger movements on the right.right grip is weak. Orbits left over right upper extremity. Mild weakness of right hip flexors and ankle dorsiflexors only. Normal sensation. Gait deferred.  Discharge Diet  Heart healthy thin liquids  DISCHARGE PLAN  Disposition:  Transfer to [facility]  Inpatient Rehab for ongoing PT, OT and ST  aspirin 325 mg daily for secondary stroke prevention.  Recommend ongoing risk factor control by Primary Care Physician at time of discharge from inpatient rehabilitation.  Follow-up No primary care provider on file. in 2 weeks following discharge from rehab.  Follow-up in [facility] in 4 weeks following discharge  from rehab, office to schedule an appointment.   30 minutes were spent preparing discharge.  Annie Main, MSN, APRN, ANVP-BC, AGPCNP-BC Advanced Practice Stroke Nurse [facility] See Amion for Schedule & Pager information 02/06/2018 4:07 PM

## 2018-02-06 NOTE — Progress Notes (Signed)
Physical Medicine and Rehabilitation Consult   Reason for Consult: Functional decline due to stroke Referring Physician: Dr. Pearlean Brownie   HPI: Brandy Barnett is a 85 y.o. female T2DM with CKD stage IV/V, HTN, anemia;  who was admitted on 01/24/2019 transient loss of consciousness with weakness right side, fall and inability to walk.  CT of head negative for bleed.  TPA administered.  CTA head neck was negative for large vessel occlusion or significant stenosis.  MRI/MRA brain done showing no acute intracranial process, mild to moderate chronic small vessel changes and advanced brainstem atrophy and basal ganglia mineralization with question of multisystem atrophy or other neurodegenerative syndromes.  2D echo done showing EF of 60-65%, no wall abnormality and grade 1 diastolic dysfunction. Dr. Pearlean Brownie felt that patient with small subcortical stroke not seen on x ray. Therapy evaluations done revealing deficits due to elevated BP, weakness and decreased coordination. CIR recommended for follow up therapy.   Patient still feels weak in the right arm more than in the right leg Discussed with neurology, Dr. Pearlean Brownie Review of Systems  Constitutional: Negative for chills and fever.  HENT: Positive for hearing loss.   Eyes: Negative for blurred vision and double vision.  Respiratory: Negative for cough and shortness of breath.   Cardiovascular: Negative for chest pain and palpitations.  Gastrointestinal: Negative for constipation, heartburn and nausea.  Genitourinary: Negative for dysuria.  Musculoskeletal: Positive for falls (none since last past spring. ). Negative for myalgias and neck pain.  Skin: Negative for rash.  Neurological: Positive for focal weakness and headaches. Negative for sensory change and speech change.  Psychiatric/Behavioral: Negative for memory loss. The patient does not have insomnia.          Past Medical History:  Diagnosis Date  . Chronic kidney disease   . Diabetes  mellitus   . Hypertension   . Iron deficiency anemia   . Low back pain   . Stroke (HCC)   . Thyroid disease          Past Surgical History:  Procedure Laterality Date  . EYE SURGERY    . JOINT REPLACEMENT    . TOTAL ABDOMINAL HYSTERECTOMY      Family History  Problem Relation Age of Onset  . Diabetes Mother   . Cancer Mother   . Cancer Father   . Diabetes Brother      Social History:  Lives with son and his wife. Son retired and they have assistance with home Insurance account manager. 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 She reports that she does not drink alcohol. She does not use chew, alcohol or illicit drugs.     Allergies: No Known Allergies          Medications Prior to Admission  Medication Sig Dispense Refill  . acetaminophen (TYLENOL) 500 MG tablet Take 1,000 mg by mouth every 6 (six) hours as needed for headache (pain).    Marland Kitchen albuterol (PROVENTIL HFA;VENTOLIN HFA) 108 (90 Base) MCG/ACT inhaler Inhale 2 puffs into the lungs every 6 (six) hours as needed for wheezing or shortness of breath.    . Ascorbic Acid (VITAMIN C) 1000 MG tablet Take 1,000 mg by mouth daily.    Marland Kitchen aspirin 81 MG EC tablet Take 81 mg by mouth daily.      . DULoxetine (CYMBALTA) 30 MG capsule Take 30 mg by mouth daily.  3  . glipiZIDE (GLUCOTROL XL) 2.5 MG 24 hr tablet Take 2.5 mg by mouth daily.  1  . hydrochlorothiazide (HYDRODIURIL) 25 MG tablet Take 12.5 mg by mouth every Monday, Wednesday, and Friday.  6  . Multiple Vitamins-Minerals (PRESERVISION AREDS 2) CAPS Take 1 capsule by mouth 2 (two) times daily.    . rosuvastatin (CRESTOR) 10 MG tablet Take 10 mg by mouth every evening.  3  . sevelamer carbonate (RENVELA) 800 MG tablet Take 800 mg by mouth daily with lunch.  0  . traMADol (ULTRAM) 50 MG tablet Take 50 mg by mouth daily as needed (pain).   0    Home: Home Living Family/patient expects to be discharged to:: Private residence Living  Arrangements: Children Available Help at Discharge: Family Type of Home: House Home Access: Stairs to enter Secretary/administrator of Steps: 3 Entrance Stairs-Rails: Right, Left(cannot reach both at same time. ) Home Layout: One level Bathroom Shower/Tub: Health visitor: Handicapped height Home Equipment: Information systems manager - built in, Environmental consultant - 2 wheels, Medical laboratory scientific officer - single point  Lives With: Son  Functional History: Prior Function Level of Independence: Independent Functional Status:  Mobility: Bed Mobility Overal bed mobility: Needs Assistance Bed Mobility: Rolling, Sidelying to Sit Rolling: Min guard, Min assist Sidelying to sit: Min assist General bed mobility comments: min assist to roll left, minguard to right, assist to elevate trunk from surface Transfers Overall transfer level: Needs assistance Transfers: Sit to/from Stand, Stand Pivot Transfers Sit to Stand: Min assist Stand pivot transfers: Mod assist, +2 physical assistance General transfer comment: cues for posture and HHA to stand, mod assist to pivot with +2 for safety and advancing RLE.  Ambulation/Gait General Gait Details: unable due to BP  ADL:  Cognition: Cognition Overall Cognitive Status: Within Functional Limits for tasks assessed Orientation Level: Oriented X4 Cognition Arousal/Alertness: Awake/alert Behavior During Therapy: WFL for tasks assessed/performed Overall Cognitive Status: Within Functional Limits for tasks assessed  Blood pressure (!) 172/82, pulse 87, temperature 97.6 F (36.4 C), temperature source Oral, resp. rate 18, height 5\' 2"  (1.575 m), weight 65.8 kg, SpO2 97 %. Physical Exam  Nursing note and vitals reviewed. Constitutional: She is oriented to person, place, and time.  Pleasant and appropriate.   Musculoskeletal:  Right ankle with tight heel cord.  Pedal edema Left > right.   Neurological: She is alert and oriented to person, place, and time.  Mild right facial  weakness with mild dysarthria. decreased hearing. Few beats nystagmus laterally. She is able to follow basic commands without difficulty. Right hemiparesis.    Lungs clear Heart regular rate and rhythm no murmurs Abdomen positive bowel sounds soft nontender palpation Extremities no clubbing cyanosis or edema Motor strength is 2- in the right deltoid bicep tricep grip 3 at the right hip flexor knee extensor ankle dorsiflexor 5/5 in the left deltoid bicep tricep grip, hip flexor knee extensor ankle dorsi flexor. Sensation intact light touch Cannot perform cerebellar testing finger-nose-finger on the right side due to weakness LabResultsLast24Hours  Results for orders placed or performed during the hospital encounter of 02/02/18 (from the past 24 hour(s))  Glucose, capillary     Status: Abnormal   Collection Time: 02/04/18 12:39 PM  Result Value Ref Range   Glucose-Capillary 126 (H) 70 - 99 mg/dL  Glucose, capillary     Status: Abnormal   Collection Time: 02/04/18  5:03 PM  Result Value Ref Range   Glucose-Capillary 129 (H) 70 - 99 mg/dL  Glucose, capillary     Status: Abnormal   Collection Time: 02/04/18  9:51 PM  Result Value Ref  Range   Glucose-Capillary 141 (H) 70 - 99 mg/dL   Comment 1 Notify RN    Comment 2 Document in Chart   Basic metabolic panel     Status: Abnormal   Collection Time: 02/05/18  3:03 AM  Result Value Ref Range   Sodium 143 135 - 145 mmol/L   Potassium 3.8 3.5 - 5.1 mmol/L   Chloride 112 (H) 98 - 111 mmol/L   CO2 22 22 - 32 mmol/L   Glucose, Bld 102 (H) 70 - 99 mg/dL   BUN 29 (H) 8 - 23 mg/dL   Creatinine, Ser 1.61 (H) 0.44 - 1.00 mg/dL   Calcium 8.7 (L) 8.9 - 10.3 mg/dL   GFR calc non Af Amer 33 (L) >60 mL/min   GFR calc Af Amer 38 (L) >60 mL/min   Anion gap 9 5 - 15  Glucose, capillary     Status: Abnormal   Collection Time: 02/05/18  6:13 AM  Result Value Ref Range   Glucose-Capillary 107 (H) 70 - 99 mg/dL   Comment 1  Notify RN    Comment 2 Document in Chart       ImagingResults(Last48hours)  Mr Brain Wo Contrast  Result Date: 02/03/2018 CLINICAL DATA:  Follow up stroke, status post tPA. History of hypertension and diabetes. EXAM: MRI HEAD WITHOUT CONTRAST TECHNIQUE: Multiplanar, multiecho pulse sequences of the brain and surrounding structures were obtained without intravenous contrast. COMPARISON:  CT HEAD February 02, 2018. FINDINGS: INTRACRANIAL CONTENTS: No reduced diffusion to suggest acute ischemia. No susceptibility artifact to suggest hemorrhage. Moderate parenchymal brain volume loss. No hydrocephalus. Symmetric basal ganglia mineralization. Patchy supratentorial white matter FLAIR T2 hyperintensities. Abnormal atrophy and symmetric T2 bright signal within the pyramids and inferior cerebellar peduncles. No suspicious parenchymal signal, masses, mass effect. No abnormal extra-axial fluid collections. No extra-axial masses. VASCULAR: Normal major intracranial vascular flow voids present at skull base. SKULL AND UPPER CERVICAL SPINE: No abnormal sellar expansion. No suspicious calvarial bone marrow signal. Craniocervical junction maintained. SINUSES/ORBITS: The mastoid air-cells and included paranasal sinuses are well-aerated.The included ocular globes and orbital contents are non-suspicious. Status post bilateral ocular lens implants. OTHER: None. IMPRESSION: 1. No acute intracranial process. 2. Advanced brainstem atrophy and basal ganglia mineralization, constellation of findings seen with multi-system atrophy or other neuro degenerative syndromes. 3. Mild-to-moderate chronic small vessel ischemic changes. Electronically Signed   By: Awilda Metro M.D.   On: 02/03/2018 22:32      Assessment/Plan: Diagnosis: Small subcortical infarct with residual right hemiparesis upper extremity groin lower extremity. 1. Does the need for close, 24 hr/day medical supervision in concert with the patient's  rehab needs make it unreasonable for this patient to be served in a less intensive setting? Yes 2. Co-Morbidities requiring supervision/potential complications: Diabetes requiring further management, hypertension requiring further management, chronic kidney disease requiring monitoring of medications and intake. 3. Due to bladder management, bowel management, safety, skin/wound care, disease management, medication administration, pain management and patient education, does the patient require 24 hr/day rehab nursing? Yes 4. Does the patient require coordinated care of a physician, rehab nurse, PT (1-2 hrs/day, 5 days/week), OT (1-2 hrs/day, 5 days/week) and SLP (.5-1 hrs/day, 5 days/week) to address physical and functional deficits in the context of the above medical diagnosis(es)? Yes Addressing deficits in the following areas: balance, endurance, locomotion, strength, transferring, bowel/bladder control, bathing, dressing, feeding, grooming, toileting, cognition, speech and psychosocial support 5. Can the patient actively participate in an intensive therapy program of at least 3  hrs of therapy per day at least 5 days per week? Yes 6. The potential for patient to make measurable gains while on inpatient rehab is excellent 7. Anticipated functional outcomes upon discharge from inpatient rehab are modified independent and supervision  with PT, modified independent and supervision with OT, n/a with SLP. 8. Estimated rehab length of stay to reach the above functional goals is:8- 12 days 9. Anticipated D/C setting: Home 10. Anticipated post D/C treatments: HH therapy 11. Overall Rehab/Functional Prognosis: excellent  RECOMMENDATIONS: This patient's condition is appropriate for continued rehabilitative care in the following setting: CIR Patient has agreed to participate in recommended program. Yes Note that insurance prior authorization may be required for reimbursement for recommended care.   "I  have personally performed a face to face diagnostic evaluation of this patient.  Additionally, I have reviewed and concur with the physician assistant's documentation above." Erick Colace M.D. [facility] FAAPM&R (Sports Med, Neuromuscular Med) Diplomate Am Board of Electrodiagnostic Med  Jacquelynn Cree, PA-C 02/05/2018        Revision History

## 2018-02-06 NOTE — Care Management Note (Signed)
Case Management Note  Patient Details  Name: Brandy Barnett MRN: [id number] Date of Birth: [birth year]  Subjective/Objective:                    Action/Plan: Pt discharging to CIR today. CM signing off.   Expected Discharge Date:  02/06/18               Expected Discharge Plan:  IP Rehab Facility  In-House Referral:     Discharge planning Services  CM Consult  Post Acute Care Choice:    Choice offered to:     DME Arranged:    DME Agency:     HH Arranged:    HH Agency:     Status of Service:  Completed, signed off  If discussed at [employer], dates discussed:    Additional Comments:  Kermit Balo, RN 02/06/2018, 11:47 AM

## 2018-02-06 NOTE — Plan of Care (Signed)
Patient is progressing.  

## 2018-02-07 ENCOUNTER — Inpatient Hospital Stay (HOSPITAL_COMMUNITY): Payer: BLUE CROSS/BLUE SHIELD

## 2018-02-07 ENCOUNTER — Other Ambulatory Visit: Payer: Self-pay

## 2018-02-07 ENCOUNTER — Inpatient Hospital Stay (HOSPITAL_COMMUNITY): Payer: BLUE CROSS/BLUE SHIELD | Admitting: Physical Therapy

## 2018-02-07 ENCOUNTER — Inpatient Hospital Stay (HOSPITAL_COMMUNITY): Payer: BLUE CROSS/BLUE SHIELD | Admitting: Occupational Therapy

## 2018-02-07 DIAGNOSIS — I10 Essential (primary) hypertension: Secondary | ICD-10-CM

## 2018-02-07 DIAGNOSIS — G8191 Hemiplegia, unspecified affecting right dominant side: Secondary | ICD-10-CM

## 2018-02-07 DIAGNOSIS — I639 Cerebral infarction, unspecified: Secondary | ICD-10-CM

## 2018-02-07 LAB — CBC WITH DIFFERENTIAL/PLATELET
Abs Immature Granulocytes: 0 10*3/uL (ref 0.0–0.1)
Basophils Absolute: 0.1 10*3/uL (ref 0.0–0.1)
Basophils Relative: 1 %
EOS PCT: 7 %
Eosinophils Absolute: 0.7 10*3/uL (ref 0.0–0.7)
HEMATOCRIT: 35.1 % — AB (ref 36.0–46.0)
HEMOGLOBIN: 11.4 g/dL — AB (ref 12.0–15.0)
Immature Granulocytes: 0 %
LYMPHS ABS: 2.4 10*3/uL (ref 0.7–4.0)
LYMPHS PCT: 25 %
MCH: 29.5 pg (ref 26.0–34.0)
MCHC: 32.5 g/dL (ref 30.0–36.0)
MCV: 90.9 fL (ref 78.0–100.0)
MONO ABS: 0.9 10*3/uL (ref 0.1–1.0)
Monocytes Relative: 9 %
Neutro Abs: 5.8 10*3/uL (ref 1.7–7.7)
Neutrophils Relative %: 58 %
Platelets: 199 10*3/uL (ref 150–400)
RBC: 3.86 MIL/uL — AB (ref 3.87–5.11)
RDW: 12.5 % (ref 11.5–15.5)
WBC: 9.9 10*3/uL (ref 4.0–10.5)

## 2018-02-07 LAB — COMPREHENSIVE METABOLIC PANEL
ALK PHOS: 57 U/L (ref 38–126)
ALT: 10 U/L (ref 0–44)
ANION GAP: 10 (ref 5–15)
AST: 12 U/L — ABNORMAL LOW (ref 15–41)
Albumin: 3.5 g/dL (ref 3.5–5.0)
BUN: 26 mg/dL — ABNORMAL HIGH (ref 8–23)
CHLORIDE: 110 mmol/L (ref 98–111)
CO2: 22 mmol/L (ref 22–32)
Calcium: 9 mg/dL (ref 8.9–10.3)
Creatinine, Ser: 1.51 mg/dL — ABNORMAL HIGH (ref 0.44–1.00)
GFR calc non Af Amer: 31 mL/min — ABNORMAL LOW (ref >60)
GFR, EST AFRICAN AMERICAN: 36 mL/min — AB (ref >60)
GLUCOSE: 103 mg/dL — AB (ref 70–99)
POTASSIUM: 3.9 mmol/L (ref 3.5–5.1)
Sodium: 142 mmol/L (ref 135–145)
Total Bilirubin: 0.8 mg/dL (ref 0.3–1.2)
Total Protein: 6.1 g/dL — ABNORMAL LOW (ref 6.5–8.1)

## 2018-02-07 LAB — GLUCOSE, CAPILLARY
GLUCOSE-CAPILLARY: 136 mg/dL — AB (ref 70–99)
Glucose-Capillary: 101 mg/dL — ABNORMAL HIGH (ref 70–99)
Glucose-Capillary: 113 mg/dL — ABNORMAL HIGH (ref 70–99)
Glucose-Capillary: 115 mg/dL — ABNORMAL HIGH (ref 70–99)

## 2018-02-07 MED ORDER — ENOXAPARIN SODIUM 30 MG/0.3ML ~~LOC~~ SOLN
30.0000 mg | SUBCUTANEOUS | Status: DC
  Administered 2018-02-07 – 2018-02-20 (×14): 30 mg via SUBCUTANEOUS
  Filled 2018-02-07 (×14): qty 0.3

## 2018-02-07 NOTE — Progress Notes (Signed)
Social Work  Social Work Assessment and Plan  Patient Details  Name: Brandy Barnett MRN: [id number] Date of Birth: [birth year]  Today's Date: 02/07/2018  Problem List:  Patient Active Problem List   Diagnosis Date Noted  . Hyperlipidemia 02/06/2018  . 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 Right hemiparesis (HCC)   . Benign essential HTN   . Chronic bilateral low back pain without sciatica   . Iron deficiency anemia   . Chronic kidney disease (CKD), stage IV (severe) (HCC)   . Acute ischemic stroke [facility]) s/p IV tPA 02/02/2018   Past Medical History:  Past Medical History:  Diagnosis Date  . Chronic kidney disease   . Diabetes mellitus   . Hypertension   . Iron deficiency anemia   . Low back pain   . Stroke (HCC)   . Thyroid disease    Past Surgical History:  Past Surgical History:  Procedure Laterality Date  . CATARACT EXTRACTION     with lens implant for mono vision  . TOTAL ABDOMINAL HYSTERECTOMY    . TOTAL HIP ARTHROPLASTY Right 1990's   Social History:  reports that she has never smoked. She has never used smokeless tobacco. She reports that she does not drink alcohol. Her drug history is not on file.  Family / Support Systems Marital Status: Widow/Widower Patient Roles: Parent, Caregiver(for their three dogs) Children: Billy-son [phone] Other Supports: Scott-son [phone] Anticipated Caregiver: Genevie Cheshire and his wife Ability/Limitations of Caregiver: Son and daughter in-law at times will be alone Caregiver Availability: Other (Comment)(Will almost have 24 hr will be times alone) Family Dynamics: Close knit fmaily pt has two sons' who are very involved and supportive. She lives with Genevie Cheshire who is retired and her other son Scott-is in [place]. She has church members and friends who are supportive.  Social History Preferred language: English Religion: Baptist Cultural Background:  No issues Education: [employer] Read: Yes Write: Yes Employment Status: Retired Date Retired/Disabled/Unemployed: retired Firefighter for [employer] Issues: No issues Guardian/Conservator: None-according to MD pt is capable of making her own decisions while here   Abuse/Neglect Abuse/Neglect Assessment Can Be Completed: Yes Physical Abuse: Denies Verbal Abuse: Denies Sexual Abuse: Denies Exploitation of patient/patient's resources: Denies Self-Neglect: Denies  Emotional Status Pt's affect, behavior adn adjustment status: Pt is motviated to get moving and glad to be here on rehab. She is one who has taken care of herself and not neded others assistance. She does not like to ask for help and wants to be independent when she goes home. Recent Psychosocial Issues: other health issues-have been managed Pyschiatric History: No issues-deferred depression screening due to coping appropriately and verbalizes her feelings and concerns. Will monitor and see if team recommends follow up by neuro-psych. Substance Abuse History: No issues  Patient / Family Perceptions, Expectations & Goals Pt/Family understanding of illness & functional limitations: Pt and son can explain her stroke and deficits and snycope episode. Both talk with the MD daily and feel their questions and concerns are being addressed. She feels she needs to get stronger and work on her balance while here. Premorbid pt/family roles/activities: Mom, grandmother, retiree, church member, friend, etc Anticipated changes in roles/activities/participation: resume Pt/family expectations/goals: Pt states: " I want to regain my function and get back to my independent level."  Son states: " I know she wants to do on her own but I will help her  she would me if I needed help."  [employer]: None Premorbid Home Care/DME Agencies: Other (Comment)(has rw, tub seat from previous  admits) Transportation available at discharge: [employer] referrals recommended: Support group (specify)  Discharge Planning Living Arrangements: Children, Other relatives Support Systems: Children, Other relatives, Friends/neighbors, Church/faith community Type of Residence: Private residence Insurance Resources: [employer], Media planner (specify)(BCBS) Surveyor, quantity Resources: Tree surgeon, Family Support Financial Screen Referred: No Living Expenses: Lives with family Money Management: Family Does the patient have any problems obtaining your medications?: No Home Management: Family and hired assist for Hotel manager Preliminary Plans: Return home with son and daughter in-law who can provide assist but at times pt will be alone. Son is retired and in and out during the day. Will await team evaluations and work on safe discharge plans. Social Work Anticipated Follow Up Needs: HH/OP, Support Group  Clinical Impression Pleasant elderly patient who is motivated to do well and recover from her stroke/syncope episode. She has supportive son's who are willing to assist and can be there almost 24 hr a day. Will await team evaluations and work on safe discharge plan.   Lucy Chris 02/07/2018, 10:10 AM

## 2018-02-07 NOTE — Progress Notes (Signed)
Changing lovenox to 30mg  sq q24. Crcl<30  Ulyses Southward, PharmD, [place], AAHIVP, CPP Infectious Disease Pharmacist Pager: [phone] 02/07/2018 10:54 AM

## 2018-02-07 NOTE — Evaluation (Signed)
Occupational Therapy Assessment and Plan  Patient Details  Name: Brandy Barnett MRN: [id number] Date of Birth: June 24, 1932  OT Diagnosis: abnormal posture, hemiplegia affecting dominant side and muscle weakness (generalized) Rehab Potential: Rehab Potential (ACUTE ONLY): Excellent ELOS: 14-16 days   Today's Date: 02/07/2018 OT Individual Time: 9163-8466 OT Individual Time Calculation (min): 80 min     Problem List:  Patient Active Problem List   Diagnosis Date Noted  . Hyperlipidemia 02/06/2018  . Diabetes mellitus due to underlying condition with chronic kidney disease, without long-term current use of insulin ([place])   . Right hemiparesis ([place])   . Benign essential HTN   . Chronic bilateral low back pain without sciatica   . Iron deficiency anemia   . Chronic kidney disease (CKD), stage IV (severe) ([place])   . Acute ischemic stroke [facility]) s/p IV tPA 02/02/2018    Past Medical History:  Past Medical History:  Diagnosis Date  . Chronic kidney disease   . Diabetes mellitus   . Hypertension   . Iron deficiency anemia   . Low back pain   . Stroke ([place])   . Thyroid disease    Past Surgical History:  Past Surgical History:  Procedure Laterality Date  . CATARACT EXTRACTION     with lens implant for mono vision  . TOTAL ABDOMINAL HYSTERECTOMY    . TOTAL HIP ARTHROPLASTY Right 1990's    Assessment & Plan Clinical Impression: Brandy Barnett is an 84 year old RH female with history of T2DM with CKD, hypertension, anemia; who was admitted on 01/23/2018 with transient loss of consciousness with right-sided weakness, fall and inability to walk.  History taken from chart review and patient. CT of head negative and TPA administered.  CTA head neck was negative for large vessel occlusion or significant stenosis.  MRI brain reviewed, unremarkable for acute intracranial process. Per report, and MRA brain showed no acute intracranial process, mild to moderate chronic small vessel changes and advanced  brainstem atrophy as well as basal ganglia mineralization.  2D echo done revealing EF of 60 to 59%, grade 1 diastolic dysfunction and no wall abnormality.  Dr. Leonie Man felt that patient had a small subcortical stroke not seen on MRI and patient to continue with aspirin.  Therapy evaluations done revealing deficits due to elevated blood pressure, weakness and decreased coordination.  CIR recommended for follow-up therapy.  Patient transferred to CIR on 02/06/2018 .    Patient currently requires max with basic self-care skills secondary to muscle weakness, decreased cardiorespiratoy endurance, decreased coordination and decreased sitting balance, decreased standing balance, decreased postural control, hemiplegia and decreased balance strategies.  Prior to hospitalization, patient could complete BADLS, laundry and cleaning her room, walking her 2 small dogs independently. Patient will benefit from skilled intervention to increase independence with basic self-care skills prior to discharge home with care partner.  Anticipate patient will require intermittent supervision and follow up home health.  OT - End of Session Activity Tolerance: Tolerates 30+ min activity with multiple rests Endurance Deficit: Yes OT Assessment Rehab Potential (ACUTE ONLY): Excellent OT Barriers to Discharge: Decreased caregiver support OT Barriers to Discharge Comments: pt will need to be home alone for short intervals during the day totaling approximately 3 hours OT Patient demonstrates impairments in the following area(s): Balance;Endurance;Motor;Sensory;Pain OT Basic ADL's Functional Problem(s): Eating;Grooming;Bathing;Dressing;Toileting OT Advanced ADL's Functional Problem(s): Laundry OT Transfers Functional Problem(s): Toilet;Tub/Shower OT Additional Impairment(s): Fuctional Use of Upper Extremity OT Plan OT Intensity: Minimum of 1-2 x/day, 45 to 90  minutes OT Frequency: 5 out of 7 days OT Duration/Estimated Length of Stay:  14-16 days OT Treatment/Interventions: Balance/vestibular training;Discharge planning;DME/adaptive equipment instruction;Functional mobility training;Neuromuscular re-education;Pain management;Patient/family education;Psychosocial support;Self Care/advanced ADL retraining;Therapeutic Activities;Therapeutic Exercise;UE/LE Strength taining/ROM;UE/LE Coordination activities OT Self Feeding Anticipated Outcome(s): independent OT Basic Self-Care Anticipated Outcome(s): supervision with bathing and dressing OT Toileting Anticipated Outcome(s): mod I OT Bathroom Transfers Anticipated Outcome(s): mod I stand pivot OT Recommendation Patient destination: Home Follow Up Recommendations: Home health OT Equipment Recommended: None recommended by OT Equipment Details: pt already has a shower bench and an elevated toilet   Skilled Therapeutic Intervention Pt seen for initial evaluation and ADL training with a focus on balance, R side muscular activation and adaptive techniques.  Pt eager to shower. After toileting with the nurse tech, pt completed stand pivot transfers back to w/c and then to tub bench with mod A.  Pt bathed with mod A demonstrating fair sitting balance.  Pt has some active movement in RUE but not enough to actively use to assist her with bathing.  Transferred back to w/c to dress.  Overall pt needs max A with dressing but introduced hemidressing strategies.  She worked on sit to stand with L hand on bed rail and has significant R knee hyperextension in standing.   Her R middle finger is swollen and painful with ROM,  Wrapped finger with coban wrap to decrease edema and to provide support.   Discussed her home set up and how often she will need to be alone at home.  Reviewed role of OT, pt's goals, and estimated LOS. Pt requested to lay down as she began to feel very tired.  Transferred stand pivot to bed with mod A and then to supine with min A. Pt set up with all needs and bed alarm set.   OT  Evaluation Precautions/Restrictions  Precautions Precautions: Fall Precaution Comments: pt reports several falls Restrictions Weight Bearing Restrictions: No Vital Signs Therapy Vitals BP: 127/73 Pain Pain Assessment Pain Score: 0-No pain(pt only has pain if R middle finger if she tries to move it) Home Living/Prior [place] expects to be discharged to:: Private residence Living Arrangements: Children, Other relatives Available Help at Discharge: Family Type of Home: House Home Access: Stairs to enter Technical brewer of Steps: 3 Entrance Stairs-Rails: Right, Left Home Layout: One level  Lives With: Son Prior Function Level of Independence: Independent with gait, Independent with basic ADLs  Able to Take Stairs?: Yes Driving: No(stopped driving about a year ago) Vocation: Retired Leisure: Hobbies-yes (Comment) Comments: enjoys reading, spending time in back yard, has two little dogs ADL ADL ADL Comments: refer to functional navigator Vision Baseline Vision/History: (pt has B intraocular bifocal lenses) Patient Visual Report: No change from baseline Vision Assessment?: No apparent visual deficits Perception  Perception: Within Functional Limits Praxis Praxis: Intact Cognition Overall Cognitive Status: Within Functional Limits for tasks assessed Arousal/Alertness: Awake/alert Orientation Level: Person;Place;Situation Person: Oriented Place: Oriented Situation: Oriented Year: 2019 Month: September Day of Week: Correct Memory: Appears intact Immediate Memory Recall: Sock;Blue;Bed Memory Recall: Sock;Blue;Bed Memory Recall Sock: Without Cue Memory Recall Blue: Without Cue Memory Recall Bed: Without Cue Sensation Sensation Light Touch: Appears Intact Hot/Cold: Appears Intact Proprioception: Appears Intact Additional Comments: sensation grossly intact, pt does report some numbness/tingling in R foot Coordination Gross Motor  Movements are Fluid and Coordinated: No Fine Motor Movements are Fluid and Coordinated: No Coordination and Movement Description: R sided weakness Finger Nose Finger Test: unable to raise R arm  to complete test Heel Shin Test: impaired R LE Motor  Motor Motor: Hemiplegia Motor - Skilled Clinical Observations: R sided hemiparesis UE>LE Mobility  Bed Mobility Bed Mobility: Supine to Sit;Sit to Supine Supine to Sit: Minimal Assistance - Patient > 75% Sit to Supine: Minimal Assistance - Patient > 75% Transfers Sit to Stand: Minimal Assistance - Patient > 75% Stand to Sit: Minimal Assistance - Patient > 75%  Trunk/Postural Assessment  Cervical Assessment Cervical Assessment: Exceptions to WFL(forward head) Thoracic Assessment Thoracic Assessment: Exceptions to WFL(rounded shoulders) Lumbar Assessment Lumbar Assessment: Within Functional Limits Postural Control Postural Control: Deficits on evaluation Protective Responses: impaired  Balance Balance Balance Assessed: Yes Static Sitting Balance Static Sitting - Level of Assistance: 5: Stand by assistance Dynamic Sitting Balance Dynamic Sitting - Level of Assistance: 5: Stand by assistance Static Standing Balance Static Standing - Level of Assistance: 4: Min assist Dynamic Standing Balance Dynamic Standing - Level of Assistance: 3: Mod assist Extremity/Trunk Assessment RUE Assessment RUE Assessment: Exceptions to [facility] Passive Range of Motion (PROM) Comments: pt fell on R hand when she had her stroke, her R middle finger is swollen and stiff. Xray reveals arthritis but no fx.  Active Range of Motion (AROM) Comments: sh flexion 10 degrees of AROM, elbow flexion 90, finger flexion and extension 25% General Strength Comments: 2-/5 throughout arm LUE Assessment LUE Assessment: Within Functional Limits   See Function Navigator for Current Functional Status.   Refer to Care Plan for Long Term Goals  Recommendations for other  services: None    Discharge Criteria: Patient will be discharged from OT if patient refuses treatment 3 consecutive times without medical reason, if treatment goals not met, if there is a change in medical status, if patient makes no progress towards goals or if patient is discharged from hospital.  The above assessment, treatment plan, treatment alternatives and goals were discussed and mutually agreed upon: by patient  [facility] 02/07/2018, 11:44 AM

## 2018-02-07 NOTE — Care Management Note (Signed)
Inpatient Rehabilitation Center Individual Statement of Services  Patient Name:  Brandy Barnett  Date:  02/07/2018  Welcome to the Inpatient Rehabilitation Center.  Our goal is to provide you with an individualized program based on your diagnosis and situation, designed to meet your specific needs.  With this comprehensive rehabilitation program, you will be expected to participate in at least 3 hours of rehabilitation therapies Monday-Friday, with modified therapy programming on the weekends.  Your rehabilitation program will include the following services:  Physical Therapy (PT), Occupational Therapy (OT), 24 hour per day rehabilitation nursing, Case Management (Social Worker), Rehabilitation Medicine, Nutrition Services and Pharmacy Services  Weekly team conferences will be held on Wednesday to discuss your progress.  Your Social Worker will talk with you frequently to get your input and to update you on team discussions.  Team conferences with you and your family in attendance may also be held.  Expected length of stay: 14-16 days  Overall anticipated outcome: supervision -independent with device  Depending on your progress and recovery, your program may change. Your Social Worker will coordinate services and will keep you informed of any changes. Your Social Worker's name and contact numbers are listed  below.  The following services may also be recommended but are not provided by the Inpatient Rehabilitation Center:    Home Health Rehabiltiation Services  Outpatient Rehabilitation Services    Arrangements will be made to provide these services after discharge if needed.  Arrangements include referral to agencies that provide these services.  Your insurance has been verified to be:  Medicare & BCBS Your primary doctor is:  Doreatha Martin  Pertinent information will be shared with your doctor and your insurance company.  Social Worker:  Dossie Der, SW [phone] or [phone]  Information discussed with and copy given to patient by: Lucy Chris, 02/07/2018, 9:25 AM

## 2018-02-07 NOTE — Progress Notes (Signed)
Patient information reviewed and entered into eRehab system by Jc Veron, RN, CRRN, PPS Coordinator.  Information including medical coding and functional independence measure will be reviewed and updated through discharge.     Per nursing patient was given "Data Collection Information Summary for Patients in Inpatient Rehabilitation Facilities with attached "Privacy Act Statement-Health Care Records" upon admission.  

## 2018-02-07 NOTE — Progress Notes (Signed)
Social Work  Kambrey Hagger, Elveria Rising  Social Worker  Physical Medicine and Rehabilitation  Patient Care Conference  Signed  Date of Service:  02/07/2018  3:18 PM          Signed        Show:Clear all [x] Manual[x] Template[] Copied  Added by: [x] Jaliel Deavers, Lemar Livings, LCSW  [] Hover for details Inpatient RehabilitationTeam Conference and Plan of Care Update Date: 02/07/2018   Time: 11:30 AM      Patient Name: Brandy Barnett      Medical Record Number: [id number]  Date of Birth: December 29, 1932 Sex: Female         Room/Bed: 4W21C/4W21C-01 Payor Info: Payor: MEDICARE / Plan: MEDICARE PART A AND B / Product Type: *No Product type* /     Admitting Diagnosis: small subcortical intarct wih residual hemiplegia  Admit Date/Time:  02/06/2018  2:13 PM Admission Comments: No comment available    Primary Diagnosis:  <principal problem not specified> Principal Problem: <principal problem not specified>       Patient Active Problem List    Diagnosis Date Noted  . Hyperlipidemia 02/06/2018  . 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 Right hemiparesis (HCC)    . Benign essential HTN    . Chronic bilateral low back pain without sciatica    . Iron deficiency anemia    . Chronic kidney disease (CKD), stage IV (severe) (HCC)    . Acute ischemic stroke [facility]) s/p IV tPA 02/02/2018      Expected Discharge Date:     Team Members Present: Physician leading conference: Dr. Claudette Laws Social Worker Present: Dossie Der, LCSW Nurse Present: Chana Bode, RN PT Present: Woodfin Ganja, PT OT Present: Ardis Rowan, COTA;Jennifer Katrinka Blazing, OT SLP Present: Feliberto Gottron, SLP PPS Coordinator present : Tora Duck, RN, CRRN       Current Status/Progress Goal Weekly Team Focus  Medical     Right upper greater than lower extremity weakness status post CVA not apparent on imaging studies, blood pressure is labile and  elevated at times  Adequate blood pressure management, reduce fall risk, reduce readmission risk  Initiate rehabilitation program   Bowel/Bladder     (P) Incontinent of Bladder and continent of bowel; LBM 02/04/18  (P) Remains continent of bladder and bowel  (P) Assess bladder and bowel q shift and toileted as needed   Swallow/Nutrition/ Hydration               ADL's       eval pending        Mobility       eval pending        Communication               Safety/Cognition/ Behavioral Observations             Pain     (P) No complaints of pain   (P) pain < or =2  (P) Assess pain q shift and provided prn medications as needed   Skin     Ecchymosis and bruising to rt anterior arm and posterior occipital;; bruising to rt hip   Patient remains free of skin breakdown and infection  Assess skin q shift and as needed and prevent further breakdown     *See Care Plan and progress notes for long and short-term goals.      Barriers to Discharge   Current Status/Progress Possible Resolutions Date  Resolved   Physician     Medical stability     Initiating PT OT  See above continue medical management on daily basis      Nursing                 PT                    OT Decreased caregiver support  pt will need to be home alone for short intervals during the day totaling approximately 3 hours             SLP            SW              Discharge Planning/Teaching Needs:    Home with son and daughter in-law who can provide evenings and intermittent assist during the day. Son is retired.     Team Discussion:  New patient setting goals and LOS. Currently mod assist level. Knee is hyperextending in therapy. MD addressing medical issues.  Revisions to Treatment Plan:  New eval    Continued Need for Acute Rehabilitation Level of Care: The patient requires daily medical management by a physician with specialized training in physical medicine and rehabilitation for the following  conditions: Daily direction of a multidisciplinary physical rehabilitation program to ensure safe treatment while eliciting the highest outcome that is of practical value to the patient.: Yes Daily medical management of patient stability for increased activity during participation in an intensive rehabilitation regime.: Yes Daily analysis of laboratory values and/or radiology reports with any subsequent need for medication adjustment of medical intervention for : Neurological problems     I attest that I was present, lead the team conference, and concur with the assessment and plan of the team. Dr. Claudette Laws   Lucy Chris 02/07/2018, 3:18 PM               Patient ID: Brandy Barnett, female   DOB: 02-06-1933, 84 y.o.   MRN: [id number]

## 2018-02-07 NOTE — Patient Care Conference (Signed)
Inpatient RehabilitationTeam Conference and Plan of Care Update Date: 02/07/2018   Time: 11:30 AM    Patient Name: Brandy Barnett      Medical Record Number: [id number]  Date of Birth: 1933/05/27 Sex: Female         Room/Bed: 4W21C/4W21C-01 Payor Info: Payor: MEDICARE / Plan: MEDICARE PART A AND B / Product Type: *No Product type* /    Admitting Diagnosis: small subcortical intarct wih residual hemiplegia  Admit Date/Time:  02/06/2018  2:13 PM Admission Comments: No comment available   Primary Diagnosis:  <principal problem not specified> Principal Problem: <principal problem not specified>  Patient Active Problem List   Diagnosis Date Noted  . Hyperlipidemia 02/06/2018  . 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 Right hemiparesis (HCC)   . Benign essential HTN   . Chronic bilateral low back pain without sciatica   . Iron deficiency anemia   . Chronic kidney disease (CKD), stage IV (severe) (HCC)   . Acute ischemic stroke [facility]) s/p IV tPA 02/02/2018    Expected Discharge Date:    Team Members Present: Physician leading conference: Dr. Claudette Laws Social Worker Present: Dossie Der, LCSW Nurse Present: Chana Bode, RN PT Present: Woodfin Ganja, PT OT Present: Ardis Rowan, COTA;Jennifer Katrinka Blazing, OT SLP Present: Feliberto Gottron, SLP PPS Coordinator present : Tora Duck, RN, CRRN     Current Status/Progress Goal Weekly Team Focus  Medical   Right upper greater than lower extremity weakness status post CVA not apparent on imaging studies, blood pressure is labile and elevated at times  Adequate blood pressure management, reduce fall risk, reduce readmission risk  Initiate rehabilitation program   Bowel/Bladder   (P) Incontinent of Bladder and continent of bowel; LBM 02/04/18  (P) Remains continent of bladder and bowel  (P) Assess bladder and bowel q shift and toileted as needed    Swallow/Nutrition/ Hydration             ADL's     eval pending        Mobility     eval pending        Communication             Safety/Cognition/ Behavioral Observations            Pain   (P) No complaints of pain   (P) pain < or =2  (P) Assess pain q shift and provided prn medications as needed   Skin   Ecchymosis and bruising to rt anterior arm and posterior occipital;; bruising to rt hip   Patient remains free of skin breakdown and infection  Assess skin q shift and as needed and prevent further breakdown      *See Care Plan and progress notes for long and short-term goals.     Barriers to Discharge  Current Status/Progress Possible Resolutions Date Resolved   Physician    Medical stability     Initiating PT OT  See above continue medical management on daily basis      Nursing                  PT                    OT Decreased caregiver support  pt will need to be home alone for short intervals during the day totaling approximately 3 hours  SLP                SW                Discharge Planning/Teaching Needs:    Home with son and daughter in-law who can provide evenings and intermittent assist during the day. Son is retired.     Team Discussion:  New patient setting goals and LOS. Currently mod assist level. Knee is hyperextending in therapy. MD addressing medical issues.  Revisions to Treatment Plan:  New eval    Continued Need for Acute Rehabilitation Level of Care: The patient requires daily medical management by a physician with specialized training in physical medicine and rehabilitation for the following conditions: Daily direction of a multidisciplinary physical rehabilitation program to ensure safe treatment while eliciting the highest outcome that is of practical value to the patient.: Yes Daily medical management of patient stability for increased activity during participation in an intensive rehabilitation regime.: Yes Daily  analysis of laboratory values and/or radiology reports with any subsequent need for medication adjustment of medical intervention for : Neurological problems   I attest that I was present, lead the team conference, and concur with the assessment and plan of the team. Dr. Claudette Laws  Lucy Chris 02/07/2018, 3:18 PM

## 2018-02-07 NOTE — Evaluation (Signed)
Physical Therapy Assessment and Plan  Patient Details  Name: Brandy Barnett MRN: [id number] Date of Birth: 1933/05/25  PT Diagnosis: Abnormal posture, Difficulty walking, Hemiparesis dominant and Muscle weakness Rehab Potential: Good ELOS: 14-16 days   Today's Date: 02/07/2018 PT Individual Time: 0800-0900 PT Individual Time Calculation (min): 60 min    Problem List:  Patient Active Problem List   Diagnosis Date Noted  . Hyperlipidemia 02/06/2018  . 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 Right hemiparesis ([place])   . Benign essential HTN   . Chronic bilateral low back pain without sciatica   . Iron deficiency anemia   . Chronic kidney disease (CKD), stage IV (severe) ([place])   . Acute ischemic stroke [facility]) s/p IV tPA 02/02/2018    Past Medical History:  Past Medical History:  Diagnosis Date  . Chronic kidney disease   . Diabetes mellitus   . Hypertension   . Iron deficiency anemia   . Low back pain   . Stroke ([place])   . Thyroid disease    Past Surgical History:  Past Surgical History:  Procedure Laterality Date  . CATARACT EXTRACTION     with lens implant for mono vision  . TOTAL ABDOMINAL HYSTERECTOMY    . TOTAL HIP ARTHROPLASTY Right 1990's    Assessment & Plan Clinical Impression: Patient is a 84 y.o. year old female with history of T2DM with CKD,hypertension, anemia; who was admitted on 01/23/2018 with transient loss of consciousness with right-sided weakness, fall and inability to walk. History taken from chart review and patient. CT of head negative and TPA administered. CTA head neck was negative for large vessel occlusion or significant stenosis. MRI brain reviewed, unremarkable for acute intracranial process. Per report, and MRA brain showed no acute intracranial process, mild to moderate chronic small vessel changes and advanced brainstem atrophy as well as basal ganglia  mineralization. 2D echo done revealing EF of 60 to 46%, grade 1 diastolic dysfunction and no wall abnormality. Dr. Leonie Man felt that patient had a small subcortical stroke not seen on MRI and patient to continue with aspirin.Therapy evaluations done revealing deficits due to elevated blood pressure, weakness and decreased coordination. CIR recommended for follow-up therapy.  Patient transferred to CIR on 02/06/2018 .   Patient currently requires mod with mobility secondary to muscle weakness, decreased coordination and decreased standing balance, decreased postural control, hemiplegia and decreased balance strategies.  Prior to hospitalization, patient was independent  with mobility and lived with Son in a House home.  Home access is 3Stairs to enter.  Patient will benefit from skilled PT intervention to maximize safe functional mobility, minimize fall risk and decrease caregiver burden for planned discharge intermittent supervision.  Anticipate patient will benefit from follow up [place] at discharge.  PT - End of Session Activity Tolerance: Tolerates 30+ min activity with multiple rests Endurance Deficit: Yes PT Assessment Rehab Potential (ACUTE/IP ONLY): Good PT Patient demonstrates impairments in the following area(s): Balance;Behavior;Motor;Endurance;Sensory PT Transfers Functional Problem(s): Bed Mobility;Bed to Chair;Car;Furniture PT Locomotion Functional Problem(s): Wheelchair Mobility;Ambulation;Stairs PT Plan PT Intensity: Minimum of 1-2 x/day ,45 to 90 minutes PT Frequency: 5 out of 7 days PT Duration Estimated Length of Stay: 14-16 days PT Treatment/Interventions: Ambulation/gait training;Discharge planning;Functional mobility training;Therapeutic Activities;Balance/vestibular training;Therapeutic Exercise;Neuromuscular re-education;Wheelchair propulsion/positioning;Cognitive remediation/compensation;DME/adaptive equipment instruction;UE/LE Strength taining/ROM;Community  reintegration;Patient/family education;UE/LE Arts development officer PT Transfers Anticipated Outcome(s): Mod I PT Locomotion Anticipated Outcome(s): Mod I-supervision PT Recommendation Recommendations for Other  Services: Therapeutic Recreation consult Follow Up Recommendations: Home health PT Patient destination: Home Equipment Recommended: To be determined  Skilled Therapeutic Intervention Evaluation completed (see details above and below) with education on PT POC and goals and individual treatment initiated with focus on functional transfers, bed mobility, ambulation and stairs. Pt supine in bed upon PT arrival, agreeable to therapy tx and denies pain. Pt transferred from supine>sitting EOB with min assist and verbal cues for techniques. Pt performed stand pivot from bed>w/c with mod assist and verbal cues for techniques. Pt transported to the gym. Pt performed stand pivot car transfer with mod assist, verbal cues for techniques. Pt ambulated x 15 ft with mod assist without AD, excessive R knee hyperextension noted. Pt ambulated x 30 ft with L rail and mod assist, therapist blocking R knee to limit hyperextension/buckling. Pt ascended/descended 1 step with L rail and mod assist, knee hyperextension noted. Pt transported back to room and left seated in w/c with needs in reach.    PT Evaluation Precautions/Restrictions Precautions Precautions: Fall Precaution Comments: pt reports several falls Restrictions Weight Bearing Restrictions: No Pain  denies pain Home Living/Prior Functioning Home Living Available Help at Discharge: Family Type of Home: House Home Access: Stairs to enter Technical brewer of Steps: 3 Entrance Stairs-Rails: Right;Left Home Layout: One level  Lives With: Son Prior Function Level of Independence: Independent with gait;Independent with basic ADLs  Able to Take Stairs?: Yes Driving: No(stopped driving about a year ago) Vocation:  Retired Leisure: Hobbies-yes (Comment) Comments: enjoys reading, spending time in back yard, has two little dogs Cognition Overall Cognitive Status: Within Functional Limits for tasks assessed Orientation Level: Oriented X4 Sensation Sensation Light Touch: Appears Intact Proprioception: Appears Intact Additional Comments: sensation grossly intact, pt does report some numbness/tingling in R foot Coordination Gross Motor Movements are Fluid and Coordinated: No Fine Motor Movements are Fluid and Coordinated: No Coordination and Movement Description: R sided weakness Heel Shin Test: impaired R LE Motor  Motor Motor: Hemiplegia Motor - Skilled Clinical Observations: R sided hemiparesis UE>LE  Mobility Bed Mobility Bed Mobility: Supine to Sit;Sit to Supine Supine to Sit: Minimal Assistance - Patient > 75% Sit to Supine: Minimal Assistance - Patient > 75% Transfers Transfers: Sit to Stand;Stand to Sit;Stand Pivot Transfers Sit to Stand: Minimal Assistance - Patient > 75% Stand to Sit: Minimal Assistance - Patient > 75% Stand Pivot Transfers: Moderate Assistance - Patient 50 - 74% Stand Pivot Transfer Details: Verbal cues for precautions/safety;Manual facilitation for weight shifting;Verbal cues for technique Locomotion  Gait Ambulation: Yes Gait Assistance: Moderate Assistance - Patient 50-74% Gait Distance (Feet): 15 Feet Assistive device: 1 person hand held assist Gait Assistance Details: Verbal cues for technique;Verbal cues for precautions/safety;Manual facilitation for weight shifting;Verbal cues for gait pattern Gait Assistance Details: R knee hyperextension noted during stance Stairs / Additional Locomotion Stairs: Yes Stairs Assistance: Moderate Assistance - Patient 50 - 74% Stair Management Technique: One rail Left Number of Stairs: 1 Height of Stairs: 6  Trunk/Postural Assessment  Cervical Assessment Cervical Assessment: Exceptions to WFL(forward head) Thoracic  Assessment Thoracic Assessment: Exceptions to WFL(rounded shoulders) Lumbar Assessment Lumbar Assessment: Within Functional Limits Postural Control Postural Control: Deficits on evaluation Protective Responses: impaired  Balance Balance Balance Assessed: Yes Static Sitting Balance Static Sitting - Level of Assistance: 5: Stand by assistance Dynamic Sitting Balance Dynamic Sitting - Level of Assistance: 5: Stand by assistance Static Standing Balance Static Standing - Level of Assistance: 4: Min assist Dynamic Standing Balance Dynamic Standing - Level  of Assistance: 3: Mod assist Extremity Assessment  RLE Assessment RLE Assessment: Exceptions to [facility] Passive Range of Motion (PROM) Comments: limited R DF lacking 3 degrees from neutral Active Range of Motion (AROM) Comments: limited secondary to weakness RLE Strength Right Hip Extension: 3+/5 Right Knee Flexion: 4-/5 Right Knee Extension: 4-/5 Right Ankle Dorsiflexion: 3-/5 Right Ankle Plantar Flexion: 3-/5     See Function Navigator for Current Functional Status.   Refer to Care Plan for Long Term Goals  Recommendations for other services: Therapeutic Recreation  Pet therapy  Discharge Criteria: Patient will be discharged from PT if patient refuses treatment 3 consecutive times without medical reason, if treatment goals not met, if there is a change in medical status, if patient makes no progress towards goals or if patient is discharged from hospital.  The above assessment, treatment plan, treatment alternatives and goals were discussed and mutually agreed upon: by patient  [name], PT, DPT 02/07/2018, 9:01 AM

## 2018-02-07 NOTE — Progress Notes (Signed)
Physical Therapy Session Note  Patient Details  Name: Brandy Barnett MRN: [id number] Date of Birth: May 08, 1933  Today's Date: 02/07/2018 PT Individual Time: 1505-1600 PT Individual Time Calculation (min): 55 min   Short Term Goals: Week 1:  PT Short Term Goal 1 (Week 1): Pt will perform bed<>chair transfer with min assist PT Short Term Goal 2 (Week 1): Pt will ambulate x 50 ft with LRAD and min assist PT Short Term Goal 3 (Week 1): Pt will perform bed mobility with supervision PT Short Term Goal 4 (Week 1): Pt will ascend/descend 4 steps with L handrail and min assist  Skilled Therapeutic Interventions/Progress Updates:   Pt received sitting in WC and agreeable to PT  Gait training with RW x 30f with mod assist to prevent GR and the RLE, PT also noted poor activation and hip abductors to stabilize pelvis in stance on the R.   Sit<>stand transfer training with mod assist and RW x 5 throughout treatment. Moderate cues for proper UE and LE placement to prevent R LOB in standing.   Nustep reciprocal movement training x 8 min level 2 with RUE support. Max cues for attention to the R LE to improve hip control and maintian neurtral ER/IR and prevent excessive IE.   Stand pivot transfers with UE and arm rests and min assist to the L, mod-max assist to the L due to poor knee contorl.   WC mobility x 75 ft with Hemi technique and moderate assist from PT due to poor traction from grip sock. Max cues for proper hemi technique and coordination.   [facility] to [facility] and [facility] and all needs met.         Therapy Documentation Precautions:  Precautions Precautions: Fall Precaution Comments: pt reports several falls Restrictions Weight Bearing Restrictions: No Vital Signs: Therapy Vitals Temp: 98.1 F (36.7 C) Temp Source: Oral Pulse Rate: 86 Resp: 16 BP: 135/69 Patient Position (if appropriate): Lying Oxygen Therapy SpO2: 97 % O2 Device: Room  Air Pain: 0/10   See Function Navigator for Current Functional Status.   Therapy/Group: Individual Therapy  ALorie Phenix9/09/2017, 4:07 PM

## 2018-02-07 NOTE — Progress Notes (Signed)
Subjective/Complaints: Patient is getting dressed with OT.  Right middle finger pain, x-rays reviewed with patient and OT per PT had hyperextension right knee during gait. Review of systems denies any chest pain shortness of breath nausea, vomiting, diarrhea, constipation  Objective: Vital Signs: Blood pressure 127/73, pulse 81, temperature 99 F (37.2 C), temperature source Oral, resp. rate 18, height '5\' 2"'$  (1.575 m), weight 64 kg, SpO2 93 %. Dg Finger Middle Right  Result Date: 02/06/2018 CLINICAL DATA:  Recent fall.  Pain. EXAM: RIGHT MIDDLE FINGER 2+V COMPARISON:  No recent prior. FINDINGS: Diffuse severe degenerative change. No acute bony abnormality identified. No evidence of fracture. IMPRESSION: Diffuse severe degenerative change.  No acute abnormality. Electronically Signed   By: Marcello Moores  Register   On: 02/06/2018 16:14   Results for orders placed or performed during the hospital encounter of 02/06/18 (from the past 72 hour(s))  Glucose, capillary     Status: Abnormal   Collection Time: 02/06/18  4:43 PM  Result Value Ref Range   Glucose-Capillary 141 (H) 70 - 99 mg/dL  Glucose, capillary     Status: Abnormal   Collection Time: 02/06/18  9:04 PM  Result Value Ref Range   Glucose-Capillary 106 (H) 70 - 99 mg/dL  CBC WITH DIFFERENTIAL     Status: Abnormal   Collection Time: 02/07/18  5:15 AM  Result Value Ref Range   WBC 9.9 4.0 - 10.5 K/uL   RBC 3.86 (L) 3.87 - 5.11 MIL/uL   Hemoglobin 11.4 (L) 12.0 - 15.0 g/dL   HCT 35.1 (L) 36.0 - 46.0 %   MCV 90.9 78.0 - 100.0 fL   MCH 29.5 26.0 - 34.0 pg   MCHC 32.5 30.0 - 36.0 g/dL   RDW 12.5 11.5 - 15.5 %   Platelets 199 150 - 400 K/uL   Neutrophils Relative % 58 %   Neutro Abs 5.8 1.7 - 7.7 K/uL   Lymphocytes Relative 25 %   Lymphs Abs 2.4 0.7 - 4.0 K/uL   Monocytes Relative 9 %   Monocytes Absolute 0.9 0.1 - 1.0 K/uL   Eosinophils Relative 7 %   Eosinophils Absolute 0.7 0.0 - 0.7 K/uL   Basophils Relative 1 %   Basophils  Absolute 0.1 0.0 - 0.1 K/uL   Immature Granulocytes 0 %   Abs Immature Granulocytes 0.0 0.0 - 0.1 K/uL    Comment: Performed at [facility], [address], [place], [place] [address]  Comprehensive metabolic panel     Status: Abnormal   Collection Time: 02/07/18  5:15 AM  Result Value Ref Range   Sodium 142 135 - 145 mmol/L   Potassium 3.9 3.5 - 5.1 mmol/L   Chloride 110 98 - 111 mmol/L   CO2 22 22 - 32 mmol/L   Glucose, Bld 103 (H) 70 - 99 mg/dL   BUN 26 (H) 8 - 23 mg/dL   Creatinine, Ser 1.51 (H) 0.44 - 1.00 mg/dL   Calcium 9.0 8.9 - 10.3 mg/dL   Total Protein 6.1 (L) 6.5 - 8.1 g/dL   Albumin 3.5 3.5 - 5.0 g/dL   AST 12 (L) 15 - 41 U/L   ALT 10 0 - 44 U/L   Alkaline Phosphatase 57 38 - 126 U/L   Total Bilirubin 0.8 0.3 - 1.2 mg/dL   GFR calc non Af Amer 31 (L) >60 mL/min   GFR calc Af Amer 36 (L) >60 mL/min    Comment: (NOTE) The eGFR has been calculated using the CKD  EPI equation. This calculation has not been validated in all clinical situations. eGFR's persistently <60 mL/min signify possible Chronic Kidney Disease.    Anion gap 10 5 - 15    Comment: Performed at [facility] [address] [place], [place] [address]  Glucose, capillary     Status: Abnormal   Collection Time: 02/07/18  6:32 AM  Result Value Ref Range   Glucose-Capillary 101 (H) 70 - 99 mg/dL     HEENT: normal Cardio: RRR and No murmur Resp: CTA B/L and Unlabored GI: BS positive and Distention Extremity:  No Edema Skin:   Intact Neuro: Alert/Oriented, Normal Sensory, Abnormal Motor 3/5 in the right deltoid, bicep, tricep, grip, 4/5 in the right hip flexor knee extensor ankle dorsiflexor and Normal [facility] Musc/Skel:  Normal General no acute distress, mood and affect are appropriate   Assessment/Plan: 1. Functional deficits secondary to small left subcortical infarct not noted on MRI which require 3+ hours per day of interdisciplinary therapy in a comprehensive inpatient rehab  setting. Physiatrist is providing close team supervision and 24 hour management of active medical problems listed below. Physiatrist and rehab team continue to assess barriers to discharge/monitor patient progress toward functional and medical goals. FIM:                   Function - Comprehension Comprehension: Auditory Comprehension assist level: Understands basic 75 - 89% of the time/ requires cueing 10 - 24% of the time  Function - Expression Expression: Verbal Expression assist level: Expresses basic 90% of the time/requires cueing < 10% of the time.  Function - Social Interaction Social Interaction assist level: Interacts appropriately 75 - 89% of the time - Needs redirection for appropriate language or to initiate interaction.     Function - Memory Memory assist level: Recognizes or recalls 90% of the time/requires cueing < 10% of the time  Medical Problem List and Plan: 1.Functional deficits due to RUE>RLE weaknesssecondary to small subcortical infarct Rehab eval today 2. DVT Prophylaxis/Anticoagulation: Pharmaceutical:Lovenox 3. Pain Management:Tylenol prn for HA. Tramadol prn for moderate to severe pain.  4. Mood:LCSW to follow for evaluation and and support 5. Neuropsych: This patientiscapable of making decisions onherown behalf. 6. Skin/Wound Care:Routine pressure relief measures 7. Fluids/Electrolytes/Nutrition:Monitor I's and O's. Check lytesin a.m. 8.HTN: Monitor: Blood pressures twice daily--has been labile. Avoid hypotension to allow for adequate perfusion. Will continue Norvasc daily Vitals:   02/07/18 0406 02/07/18 0906  BP: (!) 149/80 127/73  Pulse: 81   Resp: 18   Temp: 99 F (37.2 C)   SpO2: 93%   controlled 9/49.T2DM: We will monitor blood sugars before meals at bedtime. Resume home dose Glucotrol. CBG (last 3)  Controlled 9/4 Recent Labs    02/06/18 1643 02/06/18 2104 02/07/18 0632  GLUCAP 141* 106* 101*   10.Chronic low back pain: Resume Cymbalta. Question tramadol as needed for pain 11.Iron deficiency anemia: Recheck CBC 12.CKD stage IV: Has improved with hydration ---DC IV fluids as intake has been good. BUN/SCr 52/1.7 at admission.  LOS (Days) 1 A FACE TO FACE EVALUATION WAS PERFORMED  Charlett Blake 02/07/2018, 10:11 AM

## 2018-02-08 ENCOUNTER — Inpatient Hospital Stay (HOSPITAL_COMMUNITY): Payer: BLUE CROSS/BLUE SHIELD | Admitting: Physical Therapy

## 2018-02-08 ENCOUNTER — Inpatient Hospital Stay (HOSPITAL_COMMUNITY): Payer: BLUE CROSS/BLUE SHIELD | Admitting: Occupational Therapy

## 2018-02-08 LAB — GLUCOSE, CAPILLARY
GLUCOSE-CAPILLARY: 116 mg/dL — AB (ref 70–99)
GLUCOSE-CAPILLARY: 126 mg/dL — AB (ref 70–99)
GLUCOSE-CAPILLARY: 82 mg/dL (ref 70–99)
Glucose-Capillary: 108 mg/dL — ABNORMAL HIGH (ref 70–99)

## 2018-02-08 MED ORDER — BLOOD PRESSURE CONTROL BOOK
Freq: Once | Status: AC
  Administered 2018-02-08: 13:00:00
  Filled 2018-02-08: qty 1

## 2018-02-08 MED ORDER — LIVING WELL WITH DIABETES BOOK
Freq: Once | Status: AC
  Administered 2018-02-08: 13:00:00
  Filled 2018-02-08: qty 1

## 2018-02-08 NOTE — Discharge Instructions (Signed)
Inpatient Rehab Discharge Instructions  Brandy Barnett Discharge date and time:    Activities/Precautions/ Functional Status: Activity: no lifting, driving, or strenuous exercise for Until cleared by MD Diet: cardiac diet and diabetic diet Wound Care:   Functional status:  ___ No restrictions     ___ Walk up steps independently ___ 24/7 supervision/assistance   ___ Walk up steps with assistance ___ Intermittent supervision/assistance  ___ Bathe/dress independently ___ Walk with walker     ___ Bathe/dress with assistance ___ Walk Independently    ___ Shower independently ___ Walk with assistance    ___ Shower with assistance ___ No alcohol     ___ Return to work/school ________  Special Instructions:  STROKE/TIA DISCHARGE INSTRUCTIONS SMOKING Cigarette smoking nearly doubles your risk of having a stroke & is the single most alterable risk factor  If you smoke or have smoked in the last 12 months, you are advised to quit smoking for your health.  Most of the excess cardiovascular risk related to smoking disappears within a year of stopping.  Ask you doctor about anti-smoking medications  [place] Quit Line: 1-800-QUIT NOW  Free Smoking Cessation Classes [phone]  CHOLESTEROL Know your levels; limit fat & cholesterol in your diet  Lipid Panel     Component Value Date/Time   CHOL 83 02/03/2018 0323   TRIG 58 02/03/2018 0323   HDL 37 (L) 02/03/2018 0323   CHOLHDL 2.2 02/03/2018 0323   VLDL 12 02/03/2018 0323   LDLCALC 34 02/03/2018 0323      Many patients benefit from treatment even if their cholesterol is at goal.  Goal: Total Cholesterol (CHOL) less than 160  Goal:  Triglycerides (TRIG) less than 150  Goal:  HDL greater than 40  Goal:  LDL (LDLCALC) less than 100   BLOOD PRESSURE American Stroke Association blood pressure target is less that 120/80 mm/Hg  Your discharge blood pressure is:  BP: (!) 148/77  Monitor your blood pressure  Limit your salt and alcohol  intake  Many individuals will require more than one medication for high blood pressure  DIABETES (A1c is a blood sugar average for last 3 months) Goal HGBA1c is under 7% (HBGA1c is blood sugar average for last 3 months)  Diabetes:   Lab Results  Component Value Date   HGBA1C 6.0 (H) 02/03/2018     Your HGBA1c can be lowered with medications, healthy diet, and exercise.  Check your blood sugar as directed by your physician  Call your physician if you experience unexplained or low blood sugars.  PHYSICAL ACTIVITY/REHABILITATION Goal is 30 minutes at least 4 days per week  Activity: No driving, Therapies: See above Return to work: Not applicable  Activity decreases your risk of heart attack and stroke and makes your heart stronger.  It helps control your weight and blood pressure; helps you relax and can improve your mood.  Participate in a regular exercise program.  Talk with your doctor about the best form of exercise for you (dancing, walking, swimming, cycling).  DIET/WEIGHT Goal is to maintain a healthy weight  Your discharge diet is:  Diet Order            Diet heart healthy/carb modified Room service appropriate? Yes; Fluid consistency: Thin  Diet effective now             liquids Your height is:  Height: 5\' 2"  (157.5 cm) Your current weight is: Weight: 64 kg Your Body Mass Index (BMI) is:  BMI (Calculated): 25.8  Following the type of diet specifically designed for you will help prevent another stroke.  Your goal weight is:    Your goal Body Mass Index (BMI) is 19-24.  Healthy food habits can help reduce 3 risk factors for stroke:  High cholesterol, hypertension, and excess weight.  RESOURCES Stroke/Support Group:  Call [phone]   STROKE EDUCATION PROVIDED/REVIEWED AND GIVEN TO PATIENT Stroke warning signs and symptoms How to activate emergency medical system (call 911). Medications prescribed at discharge. Need for follow-up after discharge. Personal risk  factors for stroke. Pneumonia vaccine given:  Flu vaccine given:  My questions have been answered, the writing is legible, and I understand these instructions.  I will adhere to these goals & educational materials that have been provided to me after my discharge from the hospital.     My questions have been answered and I understand these instructions. I will adhere to these goals and the provided educational materials after my discharge from the hospital.  Patient/Caregiver Signature _______________________________ Date __________  Clinician Signature _______________________________________ Date __________  Please bring this form and your medication list with you to all your follow-up doctor's appointments.

## 2018-02-08 NOTE — Progress Notes (Signed)
Physical Therapy Session Note  Patient Details  Name: Brandy Barnett MRN: [id number] Date of Birth: 07-27-1932  Today's Date: 02/08/2018 PT Individual Time: 0800-0900 PT Individual Time Calculation (min): 60 min   Short Term Goals: Week 1:  PT Short Term Goal 1 (Week 1): Pt will perform bed<>chair transfer with min assist PT Short Term Goal 2 (Week 1): Pt will ambulate x 50 ft with LRAD and min assist PT Short Term Goal 3 (Week 1): Pt will perform bed mobility with supervision PT Short Term Goal 4 (Week 1): Pt will ascend/descend 4 steps with L handrail and min assist  Skilled Therapeutic Interventions/Progress Updates:    Pt received supine in bed, reports need to use restroom. No complaints of pain. Supine to sit with min A for trunk control with HOB elevated and use of bedrails. Stand pivot transfer bed to w/c with mod A. Toilet transfer mod A. Pt is dependent for clothing management and max A for pericare. Sit to stand with min A and grab bar to don brief and pants dependently. Sit to stand 2 x 5 reps to RW with CGA to min A, manual cues through RLE and RUE for WBing. Ambulation x 30 ft with RW and mod to max A, increased time and cues needed to advance RLE, flexed posture with gait, manual cues to prevent R knee hyperextension in stance phase. Amb fwd/back 2 x 5' with LUE support on rail in hallway, mod A, R knee blocked for improved control. Pt left seated in w/c in room at end of therapy session, needs in reach and chair alarm in place.  Therapy Documentation Precautions:  Precautions Precautions: Fall Precaution Comments: pt reports several falls Restrictions Weight Bearing Restrictions: No  See Function Navigator for Current Functional Status.   Therapy/Group: Individual Therapy  Peter Congo, PT, DPT  02/08/2018, 11:05 AM

## 2018-02-08 NOTE — Progress Notes (Signed)
Subjective/Complaints: Slept ok No bowel or bladder issues Review of systems denies any chest pain shortness of breath nausea, vomiting, diarrhea, constipation  Objective: Vital Signs: Blood pressure (!) 148/77, pulse 94, temperature 98.2 F (36.8 C), temperature source Oral, resp. rate 18, height _0  (1.575 m), weight 64 kg, SpO2 94 %. Dg Finger Middle Right  Result Date: 02/06/2018 CLINICAL DATA:  Recent fall.  Pain. EXAM: RIGHT MIDDLE FINGER 2+V COMPARISON:  No recent prior. FINDINGS: Diffuse severe degenerative change. No acute bony abnormality identified. No evidence of fracture. IMPRESSION: Diffuse severe degenerative change.  No acute abnormality. Electronically Signed   By: Marcello Moores  Register   On: 02/06/2018 16:14   Results for orders placed or performed during the hospital encounter of 02/06/18 (from the past 72 hour(s))  Glucose, capillary     Status: Abnormal   Collection Time: 02/06/18  4:43 PM  Result Value Ref Range   Glucose-Capillary 141 (H) 70 - 99 mg/dL  Glucose, capillary     Status: Abnormal   Collection Time: 02/06/18  9:04 PM  Result Value Ref Range   Glucose-Capillary 106 (H) 70 - 99 mg/dL  CBC WITH DIFFERENTIAL     Status: Abnormal   Collection Time: 02/07/18  5:15 AM  Result Value Ref Range   WBC 9.9 4.0 - 10.5 K/uL   RBC 3.86 (L) 3.87 - 5.11 MIL/uL   Hemoglobin 11.4 (L) 12.0 - 15.0 g/dL   HCT 35.1 (L) 36.0 - 46.0 %   MCV 90.9 78.0 - 100.0 fL   MCH 29.5 26.0 - 34.0 pg   MCHC 32.5 30.0 - 36.0 g/dL   RDW 12.5 11.5 - 15.5 %   Platelets 199 150 - 400 K/uL   Neutrophils Relative % 58 %   Neutro Abs 5.8 1.7 - 7.7 K/uL   Lymphocytes Relative 25 %   Lymphs Abs 2.4 0.7 - 4.0 K/uL   Monocytes Relative 9 %   Monocytes Absolute 0.9 0.1 - 1.0 K/uL   Eosinophils Relative 7 %   Eosinophils Absolute 0.7 0.0 - 0.7 K/uL   Basophils Relative 1 %   Basophils Absolute 0.1 0.0 - 0.1 K/uL   Immature Granulocytes 0 %   Abs Immature Granulocytes 0.0 0.0 - 0.1 K/uL     Comment: Performed at [facility], [address] [place], [place] [address]  Comprehensive metabolic panel     Status: Abnormal   Collection Time: 02/07/18  5:15 AM  Result Value Ref Range   Sodium 142 135 - 145 mmol/L   Potassium 3.9 3.5 - 5.1 mmol/L   Chloride 110 98 - 111 mmol/L   CO2 22 22 - 32 mmol/L   Glucose, Bld 103 (H) 70 - 99 mg/dL   BUN 26 (H) 8 - 23 mg/dL   Creatinine, Ser 1.51 (H) 0.44 - 1.00 mg/dL   Calcium 9.0 8.9 - 10.3 mg/dL   Total Protein 6.1 (L) 6.5 - 8.1 g/dL   Albumin 3.5 3.5 - 5.0 g/dL   AST 12 (L) 15 - 41 U/L   ALT 10 0 - 44 U/L   Alkaline Phosphatase 57 38 - 126 U/L   Total Bilirubin 0.8 0.3 - 1.2 mg/dL   GFR calc non Af Amer 31 (L) >60 mL/min   GFR calc Af Amer 36 (L) >60 mL/min    Comment: (NOTE) The eGFR has been calculated using the CKD EPI equation. This calculation has not been validated in all clinical situations. eGFR's persistently <60 mL/min signify  possible Chronic Kidney Disease.    Anion gap 10 5 - 15    Comment: Performed at [facility] [address], [place], [place] [address]  Glucose, capillary     Status: Abnormal   Collection Time: 02/07/18  6:32 AM  Result Value Ref Range   Glucose-Capillary 101 (H) 70 - 99 mg/dL  Glucose, capillary     Status: Abnormal   Collection Time: 02/07/18 11:48 AM  Result Value Ref Range   Glucose-Capillary 136 (H) 70 - 99 mg/dL  Glucose, capillary     Status: Abnormal   Collection Time: 02/07/18  4:47 PM  Result Value Ref Range   Glucose-Capillary 113 (H) 70 - 99 mg/dL  Glucose, capillary     Status: Abnormal   Collection Time: 02/07/18  9:11 PM  Result Value Ref Range   Glucose-Capillary 115 (H) 70 - 99 mg/dL  Glucose, capillary     Status: Abnormal   Collection Time: 02/08/18  6:44 AM  Result Value Ref Range   Glucose-Capillary 116 (H) 70 - 99 mg/dL     HEENT: normal Cardio: RRR and No murmur Resp: CTA B/L and Unlabored GI: BS positive and Distention Extremity:  No  Edema Skin:   Intact Neuro: Alert/Oriented, Normal Sensory, Abnormal Motor 3/5 in the right deltoid, bicep, tricep, grip, 4/5 in the right hip flexor knee extensor ankle dorsiflexor and Normal FMC Musc/Skel:  Normal General no acute distress, mood and affect are appropriate   Assessment/Plan: 1. Functional deficits secondary to small left subcortical infarct not noted on MRI which require 3+ hours per day of interdisciplinary therapy in a comprehensive inpatient rehab setting. Physiatrist is providing close team supervision and 24 hour management of active medical problems listed below. Physiatrist and rehab team continue to assess barriers to discharge/monitor patient progress toward functional and medical goals. FIM: Function - Bathing Position: Shower Body parts bathed by patient: Right arm, Chest, Abdomen, Right upper leg, Left upper leg, Left lower leg Body parts bathed by helper: Right lower leg, Back, Left arm, Front perineal area, Buttocks  Function- Upper Body Dressing/Undressing What is the patient wearing?: Bra, Pull over shirt/dress Bra - Perfomed by patient: Thread/unthread left bra strap Bra - Perfomed by helper: Thread/unthread right bra strap, Hook/unhook bra (pull down sports bra) Pull over shirt/dress - Perfomed by patient: Thread/unthread left sleeve Pull over shirt/dress - Perfomed by helper: Thread/unthread right sleeve, Put head through opening, Pull shirt over trunk Function - Lower Body Dressing/Undressing What is the patient wearing?: Underwear, Pants, Non-skid slipper socks Position: Wheelchair/chair at sink Underwear - Performed by helper: Thread/unthread right underwear leg, Thread/unthread left underwear leg, Pull underwear up/down Pants- Performed by helper: Thread/unthread right pants leg, Thread/unthread left pants leg, Pull pants up/down Non-skid slipper socks- Performed by helper: Don/doff right sock, Don/doff left sock Assist for footwear:  Dependant  Function - Toileting Toileting steps completed by patient: Performs perineal hygiene Toileting steps completed by helper: Adjust clothing prior to toileting, Adjust clothing after toileting Toileting Assistive Devices: Grab bar or rail Assist level: Touching or steadying assistance (Pt.75%)  Function - Air cabin crew transfer assistive device: Grab bar Assist level to toilet: Moderate assist (Pt 50 - 74%/lift or lower) Assist level from toilet: Moderate assist (Pt 50 - 74%/lift or lower)  Function - Chair/bed transfer Chair/bed transfer method: Stand pivot Chair/bed transfer assist level: Moderate assist (Pt 50 - 74%/lift or lower) Chair/bed transfer assistive device: Armrests Chair/bed transfer details: Verbal cues for technique, Verbal cues  for precautions/safety, Manual facilitation for weight shifting  Function - Locomotion: Wheelchair Will patient use wheelchair at discharge?: No Type: Manual Max wheelchair distance: 75 Assist Level: Moderate assistance (Pt 50 - 74%) Assist Level: Moderate assistance (Pt 50 - 74%) Function - Locomotion: Ambulation Assistive device: Walker-rolling Max distance: 20f Assist level: Moderate assist (Pt 50 - 74%) Assist level: Moderate assist (Pt 50 - 74%) Walk 50 feet with 2 turns activity did not occur: Safety/medical concerns Walk 150 feet activity did not occur: Safety/medical concerns Walk 10 feet on uneven surfaces activity did not occur: Safety/medical concerns  Function - Comprehension Comprehension: Auditory Comprehension assist level: Follows basic conversation/direction with no assist  Function - Expression Expression: Verbal Expression assist level: Expresses basic needs/ideas: With no assist  Function - Social Interaction Social Interaction assist level: Interacts appropriately with others - No medications needed.  Function - Problem Solving Problem solving assist level: Solves basic 90% of the  time/requires cueing < 10% of the time  Function - Memory Memory assist level: Recognizes or recalls 90% of the time/requires cueing < 10% of the time Patient normally able to recall (first 3 days only): Current season, Location of own room, Staff names and faces, That he or she is in a hospital  Medical Problem List and Plan: 1.Functional deficits due to RUE>RLE weaknesssecondary to small subcortical infarct CIR PT, OT,  2. DVT Prophylaxis/Anticoagulation: Pharmaceutical:Lovenox 3. Pain Management:Tylenol prn for HA. Tramadol prn for moderate to severe pain.  4. Mood:LCSW to follow for evaluation and and support 5. Neuropsych: This patientiscapable of making decisions onherown behalf. 6. Skin/Wound Care:Routine pressure relief measures 7. Fluids/Electrolytes/Nutrition:Monitor I's and O's. Check lytesin a.m. 8.HTN: Monitor: Blood pressures twice daily--has been labile. Avoid hypotension to allow for adequate perfusion. Will continue Norvasc daily Vitals:   02/07/18 1940 02/08/18 0419  BP: (!) 163/69 (!) 148/77  Pulse: 89 94  Resp: 18 18  Temp: 97.9 F (36.6 C) 98.2 F (36.8 C)  SpO2: 96% 94%  permissive range 9/5           9.T2DM: We will monitor blood sugars before meals at bedtime. Resume home dose Glucotrol. CBG (last 3)  Controlled 9/4 Recent Labs    02/07/18 1647 02/07/18 2111 02/08/18 0644  GLUCAP 113* 115* 116*  10.Chronic low back pain: Resume Cymbalta. Question tramadol as needed for pain 11.Iron deficiency anemia: Recheck CBC 12.CKD stage IV: Has improved with hydration ---DC IV fluids as intake has been good. BUN/SCr 52/1.7 at admission.GFR currently ~30  LOS (Days) 2 A FACE TO FACE EVALUATION WAS PERFORMED  ACharlett Blake9/10/2017, 8:52 AM

## 2018-02-08 NOTE — Progress Notes (Addendum)
Physical Therapy Session Note  Patient Details  Name: Brandy Barnett MRN: [id number] Date of Birth: 09-Aug-1932  Today's Date: 02/08/2018 PT Individual Time: 5208-0223 PT Individual Time Calculation (min): 55 min   Short Term Goals: Week 1:  PT Short Term Goal 1 (Week 1): Pt will perform bed<>chair transfer with min assist PT Short Term Goal 2 (Week 1): Pt will ambulate x 50 ft with LRAD and min assist PT Short Term Goal 3 (Week 1): Pt will perform bed mobility with supervision PT Short Term Goal 4 (Week 1): Pt will ascend/descend 4 steps with L handrail and min assist  Skilled Therapeutic Interventions/Progress Updates:   Pt in supine and agreeable to therapy, pain as detailed below. Total assist w/c transport to/from therapy gym. Worked on postural control in stance while performing clothespin activity emphasizing increased thoracic extension. Min guard w/o UE support on RW. Worked on RLE NMR in stance w/ emphasis on knee musculature and proprioception of knee to minimize hyperextension in single leg stance during gait. Performed TKEs in stance w/ tactile and manual cues and L forward and backward stepping w/ tactile and manual cues to maintain slight R knee flexion. Also performed partial knee bends in stance at slow pace to emphasize knee control. Self-propelled w/c 57' x2 w/ BLEs only to work on Environmental health practitioner. Returned to room and ended session in recliner, call bell within reach and all needs met.   Therapy Documentation Precautions:  Precautions Precautions: Fall Precaution Comments: pt reports several falls Restrictions Weight Bearing Restrictions: No Vital Signs: Therapy Vitals Temp: 97.8 F (36.6 C) Pulse Rate: 88 Resp: 18 BP: 133/78 Patient Position (if appropriate): Lying Oxygen Therapy SpO2: 97 % O2 Device: Room Air Pain: Pain Assessment Pain Scale: 0-10 Pain Score: 8  Pain Type: Acute pain Pain Location: Neck Pain Orientation: Mid Pain Radiating Towards:  shoulders Pain Descriptors / Indicators: Aching Pain Onset: Gradual Patients Stated Pain Goal: 6 Pain Intervention(s): Medication (See eMAR);Repositioned Multiple Pain Sites: Yes 2nd Pain Site Pain Location: Back Pain Intervention(s): Heat applied  See Function Navigator for Current Functional Status.   Therapy/Group: Individual Therapy  Clydell Alberts K Arnette 02/08/2018, 4:01 PM

## 2018-02-08 NOTE — Progress Notes (Signed)
Occupational Therapy Session Note  Patient Details  Name: Brandy Barnett MRN: [id number] Date of Birth: [birth year]  Today's Date: 02/08/2018 OT Individual Time: 1045-1200 OT Individual Time Calculation (min): 75 min    Short Term Goals: Week 1:  OT Short Term Goal 1 (Week 1): Pt will be able to complete stand pivot to toilet with steadying A. OT Short Term Goal 2 (Week 1): Pt will be able to toilet with steadying A. OT Short Term Goal 3 (Week 1): Pt will be able to bathe with steadying A and use of AE as needed. OT Short Term Goal 4 (Week 1): Pt will be able to don pullover shirt with min A. OT Short Term Goal 5 (Week 1): Pt will be able to stand without UE support with min A to enable her to use her L hand to pull her pants up.  Skilled Therapeutic Interventions/Progress Updates:    Pt received sleeping in bed. Pt woke easily and agreeable to therapy. Pt declined a bath or clothing change today.  She just wanted to brush her teeth. Pt worked on rolling from supine to R side with min A and then sidelying to sit with mod A.   Pt sat at EOB with S and then completed stand pivot with mod A to w/c.  Completed grooming with set up and then was taken to the gym. Pt stated that she felt she would not be able to sit unsupported for long so pt transferred to an arm chair with back support.  Doffed coban from R middle finger and edema was minimal compared to yesterday and she was able to actively flex it with minimal pain. Pt worked on various A/AROM exercises to develop motor control in RUE using table top slides, UE ranger, B hands on dowel bar, stacking cups, active tip pinch.  Pt now has 75% of grasp flexion as opposed to 25% yesterday.   As she was sitting supported, pt stated her neck and back have really been aching today.  She did sit up in w/c quite a bit yesterday and she is used to her recliner at home.  Recommended she lay down to rest her back after lunch.  Placed heat packs on neck and midback for  20 min while she was doing the arm exercises and pt felt the heat was very helpful.  Pt taken back to room and set up with her lunch tray.  Chair alarm on and all needs met.  Therapy Documentation Precautions:  Precautions Precautions: Fall Precaution Comments: pt reports several falls Restrictions Weight Bearing Restrictions: No    Pain: Pain Assessment Pain Score: 5  Pain Type: Acute pain Pain Location: Neck Pain Orientation: Mid Pain Descriptors / Indicators: Aching Pain Onset: Gradual Pain Intervention(s): Heat applied Multiple Pain Sites: Yes 2nd Pain Site Pain Location: Back Pain Intervention(s): Heat applied ADL: ADL ADL Comments: refer to functional navigator   See Function Navigator for Current Functional Status.   Therapy/Group: Individual Therapy  [place] 02/08/2018, 12:26 PM

## 2018-02-09 ENCOUNTER — Inpatient Hospital Stay (HOSPITAL_COMMUNITY): Payer: BLUE CROSS/BLUE SHIELD | Admitting: Physical Therapy

## 2018-02-09 ENCOUNTER — Inpatient Hospital Stay (HOSPITAL_COMMUNITY): Payer: BLUE CROSS/BLUE SHIELD | Admitting: Occupational Therapy

## 2018-02-09 LAB — GLUCOSE, CAPILLARY
GLUCOSE-CAPILLARY: 103 mg/dL — AB (ref 70–99)
GLUCOSE-CAPILLARY: 89 mg/dL (ref 70–99)
Glucose-Capillary: 108 mg/dL — ABNORMAL HIGH (ref 70–99)
Glucose-Capillary: 206 mg/dL — ABNORMAL HIGH (ref 70–99)

## 2018-02-09 MED ORDER — TROLAMINE SALICYLATE 10 % EX CREA: TOPICAL_CREAM | Freq: Two times a day (BID) | CUTANEOUS | Status: DC | PRN

## 2018-02-09 MED ORDER — MUSCLE RUB 10-15 % EX CREA
TOPICAL_CREAM | Freq: Two times a day (BID) | CUTANEOUS | Status: DC | PRN
  Administered 2018-02-09: 13:00:00 via TOPICAL
  Filled 2018-02-09: qty 85

## 2018-02-09 NOTE — Progress Notes (Signed)
Physical Therapy Session Note  Patient Details  Name: Brandy Barnett MRN: [id number] Date of Birth: 05/11/1933  Today's Date: 02/09/2018 PT Individual Time: 1406-1500 PT Individual Time Calculation (min): 54 min   Short Term Goals: Week 1:  PT Short Term Goal 1 (Week 1): Pt will perform bed<>chair transfer with min assist PT Short Term Goal 2 (Week 1): Pt will ambulate x 50 ft with LRAD and min assist PT Short Term Goal 3 (Week 1): Pt will perform bed mobility with supervision PT Short Term Goal 4 (Week 1): Pt will ascend/descend 4 steps with L handrail and min assist  Skilled Therapeutic Interventions/Progress Updates:    Patient received in bed, very pleasant and willing to participate in PT session today. She completed functional bed mobility with min assist, and was able to transfer to her WC with RW and Min-ModA for balance and safety today. Able to ambulate approximately 40ft with RW and Min-ModA, MaxA to place R UE on walker as she attempted to use RW with only one hand. She is easily fatiguable this afternoon and required multiple rest breaks through session. Spent quite a bit of time on static standing and building structures with PVC pipes with Mod cues to keep R knee bent instead of hyperextended, Mod VC for problem solving PVC pipe puzzles. Also worked on R LE strengthening with step ups onto 6 inch step with Mod TC and VC to prevent R knee hyperextending and for slow, controlled motor performance and verbal/tactile cues for feedback. She was left up in her WC with all needs met, alarm set, nurse tech present and attending.   Therapy Documentation Precautions:  Precautions Precautions: Fall Precaution Comments: pt reports several falls Restrictions Weight Bearing Restrictions: No General:   Pain: Pain Assessment Pain Scale: 0-10 Pain Score: 0-No pain Faces Pain Scale: No hurt   See Function Navigator for Current Functional Status.   Therapy/Group: Individual Therapy    Kristen Unger PT, DPT, CBIS  Supplemental Physical Therapist [place]    Pager [phone] Acute Rehab Office [phone]    02/09/2018, 3:49 PM  

## 2018-02-09 NOTE — Plan of Care (Signed)
  Problem: Consults Goal: RH STROKE PATIENT EDUCATION Description See Patient Education module for education specifics  Outcome: Progressing Goal: Nutrition Consult-if indicated Outcome: Progressing Goal: Diabetes Guidelines if Diabetic/Glucose > 140 Description If diabetic or lab glucose is > 140 mg/dl - Initiate Diabetes/Hyperglycemia Guidelines & Document Interventions  Outcome: Progressing   Problem: RH BOWEL ELIMINATION Goal: RH STG MANAGE BOWEL WITH ASSISTANCE Description STG Manage Bowel with  Min Assistance.  Outcome: Progressing   Problem: RH BLADDER ELIMINATION Goal: RH STG MANAGE BLADDER WITH ASSISTANCE Description STG Manage Bladder With min Assistance  Outcome: Progressing   Problem: RH SAFETY Goal: RH STG ADHERE TO SAFETY PRECAUTIONS W/ASSISTANCE/DEVICE Description STG Adhere to Safety Precautions With  Min Assistance/Device.  Outcome: Progressing   Problem: RH COGNITION-NURSING Goal: RH STG USES MEMORY AIDS/STRATEGIES W/ASSIST TO PROBLEM SOLVE Description STG Uses Memory Aids/Strategies With mod I Assistance to Problem Solve.  Outcome: Progressing   Problem: RH KNOWLEDGE DEFICIT Goal: RH STG INCREASE KNOWLEDGE OF DIABETES Description Pt will be able to explain management of DM with medications and diet using cues/reminders Outcome: Progressing Goal: RH STG INCREASE KNOWLEDGE OF HYPERTENSION Description Pt will be able to explain management of HTn using medications and diet with cues/reminders Outcome: Progressing Goal: RH STG INCREASE KNOWLEDGE OF STROKE PROPHYLAXIS Description Pt will be able to explain stroke prevention and medications using cues\/reminders Outcome: Progressing   

## 2018-02-09 NOTE — Progress Notes (Signed)
Subjective/Complaints: Neck pain last noc, no fall or trauma no radiation to arms Review of systems denies any chest pain shortness of breath nausea, vomiting, diarrhea, constipation  Objective: Vital Signs: Blood pressure 140/82, pulse 85, temperature 98.2 F (36.8 C), temperature source Oral, resp. rate 18, height '5\' 2"'$  (1.575 m), weight 64 kg, SpO2 96 %. No results found. Results for orders placed or performed during the hospital encounter of 02/06/18 (from the past 72 hour(s))  Glucose, capillary     Status: Abnormal   Collection Time: 02/06/18  4:43 PM  Result Value Ref Range   Glucose-Capillary 141 (H) 70 - 99 mg/dL  Glucose, capillary     Status: Abnormal   Collection Time: 02/06/18  9:04 PM  Result Value Ref Range   Glucose-Capillary 106 (H) 70 - 99 mg/dL  CBC WITH DIFFERENTIAL     Status: Abnormal   Collection Time: 02/07/18  5:15 AM  Result Value Ref Range   WBC 9.9 4.0 - 10.5 K/uL   RBC 3.86 (L) 3.87 - 5.11 MIL/uL   Hemoglobin 11.4 (L) 12.0 - 15.0 g/dL   HCT 35.1 (L) 36.0 - 46.0 %   MCV 90.9 78.0 - 100.0 fL   MCH 29.5 26.0 - 34.0 pg   MCHC 32.5 30.0 - 36.0 g/dL   RDW 12.5 11.5 - 15.5 %   Platelets 199 150 - 400 K/uL   Neutrophils Relative % 58 %   Neutro Abs 5.8 1.7 - 7.7 K/uL   Lymphocytes Relative 25 %   Lymphs Abs 2.4 0.7 - 4.0 K/uL   Monocytes Relative 9 %   Monocytes Absolute 0.9 0.1 - 1.0 K/uL   Eosinophils Relative 7 %   Eosinophils Absolute 0.7 0.0 - 0.7 K/uL   Basophils Relative 1 %   Basophils Absolute 0.1 0.0 - 0.1 K/uL   Immature Granulocytes 0 %   Abs Immature Granulocytes 0.0 0.0 - 0.1 K/uL    Comment: Performed at [facility], [address] [place], [place] [address]  Comprehensive metabolic panel     Status: Abnormal   Collection Time: 02/07/18  5:15 AM  Result Value Ref Range   Sodium 142 135 - 145 mmol/L   Potassium 3.9 3.5 - 5.1 mmol/L   Chloride 110 98 - 111 mmol/L   CO2 22 22 - 32 mmol/L   Glucose, Bld 103 (H) 70 - 99 mg/dL    BUN 26 (H) 8 - 23 mg/dL   Creatinine, Ser 1.51 (H) 0.44 - 1.00 mg/dL   Calcium 9.0 8.9 - 10.3 mg/dL   Total Protein 6.1 (L) 6.5 - 8.1 g/dL   Albumin 3.5 3.5 - 5.0 g/dL   AST 12 (L) 15 - 41 U/L   ALT 10 0 - 44 U/L   Alkaline Phosphatase 57 38 - 126 U/L   Total Bilirubin 0.8 0.3 - 1.2 mg/dL   GFR calc non Af Amer 31 (L) >60 mL/min   GFR calc Af Amer 36 (L) >60 mL/min    Comment: (NOTE) The eGFR has been calculated using the CKD EPI equation. This calculation has not been validated in all clinical situations. eGFR's persistently <60 mL/min signify possible Chronic Kidney Disease.    Anion gap 10 5 - 15    Comment: Performed at [facility] [address] [place], [place] [address]  Glucose, capillary     Status: Abnormal   Collection Time: 02/07/18  6:32 AM  Result Value Ref Range   Glucose-Capillary 101 (  H) 70 - 99 mg/dL  Glucose, capillary     Status: Abnormal   Collection Time: 02/07/18 11:48 AM  Result Value Ref Range   Glucose-Capillary 136 (H) 70 - 99 mg/dL  Glucose, capillary     Status: Abnormal   Collection Time: 02/07/18  4:47 PM  Result Value Ref Range   Glucose-Capillary 113 (H) 70 - 99 mg/dL  Glucose, capillary     Status: Abnormal   Collection Time: 02/07/18  9:11 PM  Result Value Ref Range   Glucose-Capillary 115 (H) 70 - 99 mg/dL  Glucose, capillary     Status: Abnormal   Collection Time: 02/08/18  6:44 AM  Result Value Ref Range   Glucose-Capillary 116 (H) 70 - 99 mg/dL  Glucose, capillary     Status: Abnormal   Collection Time: 02/08/18 12:03 PM  Result Value Ref Range   Glucose-Capillary 108 (H) 70 - 99 mg/dL   Comment 1 Notify RN   Glucose, capillary     Status: Abnormal   Collection Time: 02/08/18  5:11 PM  Result Value Ref Range   Glucose-Capillary 126 (H) 70 - 99 mg/dL   Comment 1 Notify RN   Glucose, capillary     Status: None   Collection Time: 02/08/18  9:46 PM  Result Value Ref Range   Glucose-Capillary 82 70 - 99 mg/dL  Glucose,  capillary     Status: Abnormal   Collection Time: 02/09/18  6:37 AM  Result Value Ref Range   Glucose-Capillary 108 (H) 70 - 99 mg/dL     HEENT: normal Cardio: RRR and No murmur Resp: CTA B/L and Unlabored GI: BS positive and Distention Extremity:  No Edema Skin:   Intact Neuro: Alert/Oriented, Normal Sensory, Abnormal Motor 3/5 in the right deltoid, bicep, tricep, grip, 4/5 in the right hip flexor knee extensor ankle dorsiflexor and Normal FMC Musc/Skel:  Normal, no pain to palpation in cervical paraspinals no pain with ROM General no acute distress, mood and affect are appropriate   Assessment/Plan: 1. Functional deficits secondary to small left subcortical infarct not noted on MRI which require 3+ hours per day of interdisciplinary therapy in a comprehensive inpatient rehab setting. Physiatrist is providing close team supervision and 24 hour management of active medical problems listed below. Physiatrist and rehab team continue to assess barriers to discharge/monitor patient progress toward functional and medical goals. FIM: Function - Bathing Position: Shower Body parts bathed by patient: Right arm, Chest, Abdomen, Right upper leg, Left upper leg, Left lower leg Body parts bathed by helper: Right lower leg, Back, Left arm, Front perineal area, Buttocks  Function- Upper Body Dressing/Undressing What is the patient wearing?: Bra, Pull over shirt/dress Bra - Perfomed by patient: Thread/unthread left bra strap Bra - Perfomed by helper: Thread/unthread right bra strap, Hook/unhook bra (pull down sports bra) Pull over shirt/dress - Perfomed by patient: Thread/unthread left sleeve Pull over shirt/dress - Perfomed by helper: Thread/unthread right sleeve, Put head through opening, Pull shirt over trunk Function - Lower Body Dressing/Undressing What is the patient wearing?: Underwear, Pants, Non-skid slipper socks Position: Wheelchair/chair at sink Underwear - Performed by helper:  Thread/unthread right underwear leg, Thread/unthread left underwear leg, Pull underwear up/down Pants- Performed by helper: Thread/unthread right pants leg, Thread/unthread left pants leg, Pull pants up/down Non-skid slipper socks- Performed by helper: Don/doff right sock, Don/doff left sock Assist for footwear: Dependant  Function - Toileting Toileting steps completed by patient: Performs perineal hygiene Toileting steps completed by helper: Adjust clothing prior  to toileting, Adjust clothing after toileting Toileting Assistive Devices: Grab bar or rail Assist level: Touching or steadying assistance (Pt.75%)  Function - Toilet Transfers Toilet transfer assistive device: Grab bar Assist level to toilet: Moderate assist (Pt 50 - 74%/lift or lower) Assist level from toilet: Moderate assist (Pt 50 - 74%/lift or lower)  Function - Chair/bed transfer Chair/bed transfer method: Stand pivot Chair/bed transfer assist level: Moderate assist (Pt 50 - 74%/lift or lower) Chair/bed transfer assistive device: Walker, Armrests Chair/bed transfer details: Verbal cues for technique, Verbal cues for precautions/safety, Manual facilitation for weight shifting  Function - Locomotion: Wheelchair Will patient use wheelchair at discharge?: No Type: Manual Max wheelchair distance: 75 Assist Level: Moderate assistance (Pt 50 - 74%) Assist Level: Moderate assistance (Pt 50 - 74%) Function - Locomotion: Ambulation Assistive device: Walker-rolling Max distance: 30-ft Assist level: Moderate assist (Pt 50 - 74%) Assist level: Moderate assist (Pt 50 - 74%) Walk 50 feet with 2 turns activity did not occur: Safety/medical concerns Walk 150 feet activity did not occur: Safety/medical concerns Walk 10 feet on uneven surfaces activity did not occur: Safety/medical concerns  Function - Comprehension Comprehension: Auditory Comprehension assist level: Follows basic conversation/direction with no assist  Function  - Expression Expression: Verbal Expression assist level: Expresses basic needs/ideas: With no assist  Function - Social Interaction Social Interaction assist level: Interacts appropriately with others - No medications needed.  Function - Problem Solving Problem solving assist level: Solves basic 90% of the time/requires cueing < 10% of the time  Function - Memory Memory assist level: Recognizes or recalls 90% of the time/requires cueing < 10% of the time Patient normally able to recall (first 3 days only): Current season, Location of own room, Staff names and faces, That he or she is in a hospital  Medical Problem List and Plan: 1.Functional deficits due to RUE>RLE weaknesssecondary to small subcortical infarct CIR PT, OT,  2. DVT Prophylaxis/Anticoagulation: Pharmaceutical:Lovenox 3. Pain Management:Tylenol prn for HA. Tramadol prn for moderate to severe pain. Kpad and sportscreme for neck 4. Mood:LCSW to follow for evaluation and and support 5. Neuropsych: This patientiscapable of making decisions onherown behalf. 6. Skin/Wound Care:Routine pressure relief measures 7. Fluids/Electrolytes/Nutrition:Monitor I's and O's. Check lytesin a.m. 8.HTN: Monitor: Blood pressures twice daily--has been labile. Avoid hypotension to allow for adequate perfusion. Will continue Norvasc daily Vitals:   02/08/18 2223 02/09/18 0524  BP: 132/64 140/82  Pulse: 80 85  Resp: 17 18  Temp: 98.1 F (36.7 C) 98.2 F (36.8 C)  SpO2: 98% 96%  permissive range 9/6           9.T2DM: We will monitor blood sugars before meals at bedtime. Resume home dose Glucotrol. CBG (last 3)  Controlled 9/6 Recent Labs    02/08/18 1711 02/08/18 2146 02/09/18 0637  GLUCAP 126* 82 108*  10.Chronic low back pain: Resume Cymbalta. Question tramadol as needed for pain 11.Iron deficiency anemia: Recheck CBC 12.CKD stage IV: Has improved with hydration ---DC IV fluids as intake has been good.  BUN/SCr 52/1.7 at admission.GFR currently ~30  LOS (Days) 3 A FACE TO FACE EVALUATION WAS PERFORMED  Charlett Blake 02/09/2018, 9:22 AM

## 2018-02-09 NOTE — Progress Notes (Signed)
Occupational Therapy Session Note  Patient Details  Name: Brandy Barnett MRN: [id number] Date of Birth: November 08, 1932  Today's Date: 02/09/2018 OT Individual Time: 2583-4621 OT Individual Time Calculation (min): 70 min    Short Term Goals: Week 1:  OT Short Term Goal 1 (Week 1): Pt will be able to complete stand pivot to toilet with steadying A. OT Short Term Goal 2 (Week 1): Pt will be able to toilet with steadying A. OT Short Term Goal 3 (Week 1): Pt will be able to bathe with steadying A and use of AE as needed. OT Short Term Goal 4 (Week 1): Pt will be able to don pullover shirt with min A. OT Short Term Goal 5 (Week 1): Pt will be able to stand without UE support with min A to enable her to use her L hand to pull her pants up.  Skilled Therapeutic Interventions/Progress Updates:    Pt received in bed already dressed. Pt declined a bath today requesting to bathe on Saturday.  Pt stated she had already completed grooming.   Pt taken to gym to work on various NMR exercises to increase AROM of entire RUE:  A/arom with hands on hula hoop moving hoop in various directions Table top slides Grasp and release on 1 inch blocks Grasp and release on medium sized pegs Grasp and release on soft ball and then on bean bags.   Pt is developing increased active movement patterns but still needs hand over hand guiding. Pt requested to lay down.  Stand pivot to bed min and then in sidelying to supine with min.  kpad placed and pillows and bed height angled to alleviate neck pain. Pt in bed with alarm set and all needs met.    Therapy Documentation Precautions:  Precautions Precautions: Fall Precaution Comments: pt reports several falls Restrictions Weight Bearing Restrictions: No    Pain:  minimal c/o pain in back of neck, place kpad on neck at end of session once pt was in bed ADL: ADL ADL Comments: refer to functional navigator  See Function Navigator for Current Functional  Status.   Therapy/Group: Individual Therapy  [place] 02/09/2018, 1:40 PM

## 2018-02-09 NOTE — IPOC Note (Signed)
Overall Plan of Care [facility]) Patient Details Name: Brandy Barnett MRN: [id number] DOB: Jun 08, 1932  Admitting Diagnosis: <principal problem not specified>  Hospital Problems: Active Problems:   Acute ischemic stroke [facility]) s/p IV tPA     Functional Problem List: Nursing Bladder, Bowel, Endurance, Medication Management, Motor, Safety  PT Balance, Behavior, Motor, Endurance, Sensory  OT Balance, Endurance, Motor, Sensory, Pain  SLP    TR         Basic ADL's: OT Eating, Grooming, Bathing, Dressing, Toileting     Advanced  ADL's: OT Laundry     Transfers: PT Bed Mobility, Bed to Chair, Car, Occupational psychologist, Research scientist (life sciences): PT Psychologist, prison and probation services, Ambulation, Stairs     Additional Impairments: OT Fuctional Use of Upper Extremity  SLP        TR      Anticipated Outcomes Item Anticipated Outcome  Self Feeding independent  Swallowing      Basic self-care  supervision with bathing and dressing  Toileting  mod I   Bathroom Transfers mod I stand pivot  Bowel/Bladder  managed min assist   Transfers  Mod I  Locomotion  Mod I-supervision  Communication     Cognition     Pain  n/a  Safety/Judgment  mod I    Therapy Plan: PT Intensity: Minimum of 1-2 x/day ,45 to 90 minutes PT Frequency: 5 out of 7 days PT Duration Estimated Length of Stay: 14-16 days OT Intensity: Minimum of 1-2 x/day, 45 to 90 minutes OT Frequency: 5 out of 7 days OT Duration/Estimated Length of Stay: 14-16 days      Team Interventions: Nursing Interventions Patient/Family Education, Bladder Management, Bowel Management, Disease Management/Prevention, Medication Management, Discharge Planning  PT interventions Ambulation/gait training, Discharge planning, Functional mobility training, Therapeutic Activities, Balance/vestibular training, Therapeutic Exercise, Neuromuscular re-education, Wheelchair propulsion/positioning, Cognitive remediation/compensation, DME/adaptive equipment  instruction, UE/LE Strength taining/ROM, Community reintegration, Equities trader education, UE/LE Coordination activities, Stair training  OT Interventions Warden/ranger, Discharge planning, DME/adaptive equipment instruction, Functional mobility training, Neuromuscular re-education, Pain management, Patient/family education, Psychosocial support, Self Care/advanced ADL retraining, Therapeutic Activities, Therapeutic Exercise, UE/LE Strength taining/ROM, UE/LE Coordination activities  SLP Interventions    TR Interventions    SW/CM Interventions Discharge Planning, Psychosocial Support, Patient/Family Education   Barriers to Discharge MD  Medical stability  Nursing      PT      OT Decreased caregiver support pt will need to be home alone for short intervals during the day totaling approximately 3 hours  SLP      SW       Team Discharge Planning: Destination: PT-Home ,OT- Home , SLP-  Projected Follow-up: PT-Home health PT, OT-  Home health OT, SLP-  Projected Equipment Needs: PT-To be determined, OT- None recommended by OT, SLP-  Equipment Details: PT- , OT-pt already has a shower bench and an elevated toilet Patient/family involved in discharge planning: PT- Patient,  OT-Patient, SLP-   MD ELOS: 11-16d Medical Rehab Prognosis:  Excellent Assessment:  84 year old RH female withhistory of T2DM with CKD,hypertension, anemia; who was admitted on 01/23/2018 with transient loss of consciousness with right-sided weakness, fall and inability to walk. History taken from chart review and patient. CT of head negative and TPA administered. CTA head neck was negative for large vessel occlusion or significant stenosis. MRI brain reviewed, unremarkable for acute intracranial process. Per report, and MRA brain showed no acute intracranial process, mild to moderate chronic small vessel changes and advanced  brainstem atrophy as well as basal ganglia mineralization. 2D echo done  revealing EF of 60 to 65%, grade 1 diastolic dysfunction and no wall abnormality. Dr. Pearlean Brownie felt that patient had a small subcortical stroke not seen on MRI and patient to continue with aspirin.Therapy evaluations done revealing deficits due to elevated blood pressure, weakness and decreased coordination. CIR recommended for follow-up therapy   See Team Conference Notes for weekly updates to the plan of care  Now requiring 24/7 Rehab RN,MD, as well as CIR level PT, OT .  Treatment team will focus on ADLs and mobility with goals set at Mod I

## 2018-02-09 NOTE — Progress Notes (Signed)
Physical Therapy Session Note  Patient Details  Name: Brandy Barnett MRN: [id number] Date of Birth: 27-Mar-1933  Today's Date: 02/09/2018 PT Individual Time: 0802-0900 PT Individual Time Calculation (min): 58 min   Short Term Goals: Week 1:  PT Short Term Goal 1 (Week 1): Pt will perform bed<>chair transfer with min assist PT Short Term Goal 2 (Week 1): Pt will ambulate x 50 ft with LRAD and min assist PT Short Term Goal 3 (Week 1): Pt will perform bed mobility with supervision PT Short Term Goal 4 (Week 1): Pt will ascend/descend 4 steps with L handrail and min assist  Skilled Therapeutic Interventions/Progress Updates:   Pt received supine in bed and agreeable to PT. Supine>sit transfer with min assist and moderate cues for sequencing and safety. Pt noted to have incontinent bladder. Sit<>stand at EOB x 3 with min assist for PT to remove and don clothing. Pt performed peri care standing EOB with min assist from PT to prevent Lateral LOB. Stand pivot transfer to [facility] with moderate assist and RW. Pt donned shirt with max assist using hemi technique.   Pt transported to rehab gym in [facility]. PT applied PLS to the RLE to provide mild assist to prevent excessive genu recurvatum. Gait training with RW 2x 81f with  Mod assist to improve trunk extension, and improve pelvic rotation to allow proper step length of the RLE. Pt noted to have improved knee control on second bout and proper step to gait pattern.  Forward backward gait with RW 2 x 5 each with RW and mod progressing to min assist from PT, With cues for gait pattern, posture, and knee control.   [facility] and all needs met.            Therapy Documentation Precautions:  Precautions Precautions: Fall Precaution Comments: pt reports several falls Restrictions Weight Bearing Restrictions: No General:   Vital Signs: Therapy Vitals Temp: 98.2 F (36.8 C) Temp Source: Oral Pulse  Rate: 85 Resp: 18 BP: 140/82 Patient Position (if appropriate): Lying Oxygen Therapy SpO2: 96 % O2 Device: Room Air Pain:   Mobility:   Locomotion :    Trunk/Postural Assessment :    Balance:   Exercises:   Other Treatments:     See Function Navigator for Current Functional Status.   Therapy/Group: Individual Therapy  ALorie Phenix9/11/2017, 8:59 AM

## 2018-02-10 ENCOUNTER — Inpatient Hospital Stay (HOSPITAL_COMMUNITY): Payer: BLUE CROSS/BLUE SHIELD | Admitting: Physical Therapy

## 2018-02-10 LAB — GLUCOSE, CAPILLARY
GLUCOSE-CAPILLARY: 124 mg/dL — AB (ref 70–99)
GLUCOSE-CAPILLARY: 129 mg/dL — AB (ref 70–99)
GLUCOSE-CAPILLARY: 167 mg/dL — AB (ref 70–99)
Glucose-Capillary: 119 mg/dL — ABNORMAL HIGH (ref 70–99)

## 2018-02-10 MED ORDER — INFLUENZA VAC SPLIT HIGH-DOSE 0.5 ML IM SUSY
0.5000 mL | PREFILLED_SYRINGE | INTRAMUSCULAR | Status: AC
  Administered 2018-02-11: 0.5 mL via INTRAMUSCULAR
  Filled 2018-02-10: qty 0.5

## 2018-02-10 NOTE — Progress Notes (Signed)
Physical Therapy Session Note  Patient Details  Name: Brandy Barnett MRN: [id number] Date of Birth: [birth year]  Today's Date: 02/10/2018 PT Individual Time: 2820-8138 PT Individual Time Calculation (min): 42 min   Short Term Goals: Week 1:  PT Short Term Goal 1 (Week 1): Pt will perform bed<>chair transfer with min assist PT Short Term Goal 2 (Week 1): Pt will ambulate x 50 ft with LRAD and min assist PT Short Term Goal 3 (Week 1): Pt will perform bed mobility with supervision PT Short Term Goal 4 (Week 1): Pt will ascend/descend 4 steps with L handrail and min assist  Skilled Therapeutic Interventions/Progress Updates:   Pt received sitting in WC and agreeable to PT. Pt transported to day room in [facility]. PT asisted pt to don shoes with max assist with RAFO. Stand pivot transfer to Nustep with min assist and cues for improved step to gait pattern.   Nustep reciprocal movement training BLE x 5 min and BLE ad BUE x 3 min . Cues for posture, ROM, and improved hip control to prevent IR/adduction.   Gait training wit RW and RAFO x 12f; min assist from PT to improve weight shfiting R and L with moderate cues to prevent Genu Recurvatum on the R and maintain partal knee flexion in stance. (improved knee control noted on this day)  Sit<>stand with step under the LLE to force increased WB through the RLE 2 inch x 5 and 3 inch x 4 with min fading to mod assist to improve weight shift over the RLE and prevent posterior LOB  WC mobility x 1033fwith supervision assist and moderate cues for improved use of LLLE to steer WC. [facility] and all needs met.         Therapy Documentation Precautions:  Precautions Precautions: Fall Precaution Comments: pt reports several falls Restrictions Weight Bearing Restrictions: No Pain: denies  See Function Navigator for Current Functional Status.   Therapy/Group: Individual Therapy  AuLorie Phenix/12/2017, 1:45 PM

## 2018-02-10 NOTE — Plan of Care (Signed)
  Problem: Consults Goal: RH STROKE PATIENT EDUCATION Description See Patient Education module for education specifics  Outcome: Progressing Goal: Nutrition Consult-if indicated Outcome: Progressing Goal: Diabetes Guidelines if Diabetic/Glucose > 140 Description If diabetic or lab glucose is > 140 mg/dl - Initiate Diabetes/Hyperglycemia Guidelines & Document Interventions  Outcome: Progressing   Problem: RH BOWEL ELIMINATION Goal: RH STG MANAGE BOWEL WITH ASSISTANCE Description STG Manage Bowel with  Min Assistance.  Outcome: Progressing   Problem: RH BLADDER ELIMINATION Goal: RH STG MANAGE BLADDER WITH ASSISTANCE Description STG Manage Bladder With min Assistance  Outcome: Progressing   Problem: RH SAFETY Goal: RH STG ADHERE TO SAFETY PRECAUTIONS W/ASSISTANCE/DEVICE Description STG Adhere to Safety Precautions With  Min Assistance/Device.  Outcome: Progressing   Problem: RH COGNITION-NURSING Goal: RH STG USES MEMORY AIDS/STRATEGIES W/ASSIST TO PROBLEM SOLVE Description STG Uses Memory Aids/Strategies With mod I Assistance to Problem Solve.  Outcome: Progressing   Problem: RH KNOWLEDGE DEFICIT Goal: RH STG INCREASE KNOWLEDGE OF DIABETES Description Pt will be able to explain management of DM with medications and diet using cues/reminders Outcome: Progressing Goal: RH STG INCREASE KNOWLEDGE OF HYPERTENSION Description Pt will be able to explain management of HTn using medications and diet with cues/reminders Outcome: Progressing Goal: RH STG INCREASE KNOWLEDGE OF STROKE PROPHYLAXIS Description Pt will be able to explain stroke prevention and medications using cues\/reminders Outcome: Progressing   

## 2018-02-11 ENCOUNTER — Inpatient Hospital Stay (HOSPITAL_COMMUNITY): Payer: BLUE CROSS/BLUE SHIELD | Admitting: Physical Therapy

## 2018-02-11 ENCOUNTER — Inpatient Hospital Stay (HOSPITAL_COMMUNITY): Payer: BLUE CROSS/BLUE SHIELD

## 2018-02-11 LAB — GLUCOSE, CAPILLARY
GLUCOSE-CAPILLARY: 94 mg/dL (ref 70–99)
Glucose-Capillary: 99 mg/dL (ref 70–99)
Glucose-Capillary: 132 mg/dL — ABNORMAL HIGH (ref 70–99)
Glucose-Capillary: 109 mg/dL — ABNORMAL HIGH (ref 70–99)

## 2018-02-11 NOTE — Progress Notes (Signed)
Physical Therapy Session Note  Patient Details  Name: Brandy Barnett MRN: [id number] Date of Birth: 10/27/1932  Today's Date: 02/11/2018 PT Individual Time: 1400-1500 PT Individual Time Calculation (min): 60 min   Short Term Goals: Week 1:  PT Short Term Goal 1 (Week 1): Pt will perform bed<>chair transfer with min assist PT Short Term Goal 2 (Week 1): Pt will ambulate x 50 ft with LRAD and min assist PT Short Term Goal 3 (Week 1): Pt will perform bed mobility with supervision PT Short Term Goal 4 (Week 1): Pt will ascend/descend 4 steps with L handrail and min assist  Skilled Therapeutic Interventions/Progress Updates:    no c/o pain at rest, but does report increased mid-back pain with standing.  Monitored throughout session and applied kpad at end of session.  Session focus on transfers, R weight shift, and RLE NMR with functional mobility.    Pt transitions to EOB x2 throughout session with min assist for trunk and verbal cues for sequencing supine>side lying>sit.  Stand/pivot transfers throughout session with L HHA and min assist for forward weight shift.  Ambulatory transfers x2 throughout session with RW and mod assist to control descent due to pt not reaching back for w/c.  NMR in hook lying with 3x10 bridges with adductor hold, min cues for pacing and correct form throughout.  Standing card matching task with block under L foot for forced R weight shift, min/mod for balance and cues for weight shift, upright posture, and maintaining foot positioning once standing.  Pt with tendency to try and adjust feet in standing without UE support despite PT cues for safety.  Gait training x30' +15' with RW, min assist with min multimodal cues for L weight shift, increased R step length, and upright posture.  Pt returned to EOB at end of session with Kpad set up for back pain, NT in to assess vitals.   Therapy Documentation Precautions:  Precautions Precautions: Fall Precaution Comments: pt reports  several falls Restrictions Weight Bearing Restrictions: No   See Function Navigator for Current Functional Status.   Therapy/Group: Individual Therapy  Stephania Fragmin 02/11/2018, 3:03 PM

## 2018-02-11 NOTE — Progress Notes (Signed)
Occupational Therapy Session Note  Patient Details  Name: Brandy Barnett MRN: [id number] Date of Birth: 1933/05/25  Today's Date: 02/11/2018 OT Individual Time: 1048-1200 OT Individual Time Calculation (min): 72 min    Short Term Goals: Week 1:  OT Short Term Goal 1 (Week 1): Pt will be able to complete stand pivot to toilet with steadying A. OT Short Term Goal 2 (Week 1): Pt will be able to toilet with steadying A. OT Short Term Goal 3 (Week 1): Pt will be able to bathe with steadying A and use of AE as needed. OT Short Term Goal 4 (Week 1): Pt will be able to don pullover shirt with min A. OT Short Term Goal 5 (Week 1): Pt will be able to stand without UE support with min A to enable her to use her L hand to pull her pants up.  Skilled Therapeutic Interventions/Progress Updates:    1:1. Pt reporting pain in neck/back (see below). Pt agreeable to shower this date. Pt stand pivot transfer using grab bar w/c>BSC>w/c<>TTB and touching A and VC for stepping RLE forward during pivot. Pt completes toileting with A for clothing management, however pt able to complete hygiene with supervision using lateral leans. Pt completes bathing sit to stand from tub bench with A to wash back, HOH A to wash LUE with RUE and A to wash feet. Pt able ot wash buttocks with steady A and OT facilitating WB onto grab bar. Pt dresses sit to stand level with total A to don front clasp bra (educate pt it may be easier to fasten first for next attempt to don), and MOD A for shirt using hemi techniques. Pt requires A to thread RLE into pants and advance pants past hips sit<>stand at sink. Pt able to don L sock after VC for 1 handed technique. Pt with B swelling in ankles therefore OT provides/adjusts elevating leg rest for BLE while pt sitting up in chair. Pt completes 2x10 arm skate against manual resistance, shoulder flex/ext, pro/retraction, elbow flex/ext and int/ext rotation. Exited session with pt seated in w/c, set up with  lunch tray and call light in reach.   Therapy Documentation Precautions:  Precautions Precautions: Fall Precaution Comments: pt reports several falls Restrictions Weight Bearing Restrictions: No General:   Vital Signs:   Pain: Pain Assessment Pain Scale: 0-10 Pain Score: 8  Pain Location: Head Pain Orientation: Posterior Pain Descriptors / Indicators: Aching;Headache Pain Onset: On-going Pain Intervention(s): Medication (See eMAR)(tylenol) ADL: ADL ADL Comments: refer to functional navigator  See Function Navigator for Current Functional Status.   Therapy/Group: Individual Therapy  Shon Hale 02/11/2018, 12:10 PM

## 2018-02-11 NOTE — Progress Notes (Addendum)
Patient ID: Brandy Barnett, female   DOB: [birth year], 84 y.o.   MRN: [id number]   Brandy Barnett is a 84 y.o. female who is admitted for rehab with functional deficits secondary to a left subcortical infarct  Past Medical History:  Diagnosis Date  . Chronic kidney disease   . Diabetes mellitus   . Hypertension   . Iron deficiency anemia   . Low back pain   . Stroke (HCC)   . Thyroid disease       Subjective: No new complaints. No new problems. Slept well.   Objective: Vital signs in last 24 hours: Temp:  [97.6 F (36.4 C)-98.4 F (36.9 C)] 97.6 F (36.4 C) (09/08 0447) Pulse Rate:  [78-91] 78 (09/08 0447) Resp:  [16-17] 16 (09/08 0447) BP: (131-160)/(63-84) 131/63 (09/08 0447) SpO2:  [95 %-97 %] 96 % (09/08 0447) Weight change:  Last BM Date: 02/06/18  Intake/Output from previous day: 09/07 0701 - 09/08 0700 In: 480 [P.O.:480] Out: -  Last cbgs: CBG (last 3)  Recent Labs    02/10/18 1645 02/10/18 2133 02/11/18 0632  GLUCAP 129* 167* 99   Patient Vitals for the past 24 hrs:  BP Temp Temp src Pulse Resp SpO2  02/11/18 0447 131/63 97.6 F (36.4 C) Oral 78 16 96 %  02/10/18 2046 (!) 158/71 98.4 F (36.9 C) Oral 91 16 95 %  02/10/18 1452 (!) 160/84 98.3 F (36.8 C) Oral 85 17 97 %    Physical Exam General: No apparent distress   HEENT: not dry Lungs: Normal effort. Lungs clear to auscultation, no crackles or wheezes. Cardiovascular: Regular rate and rhythm, no edema.  Grade 2/6 systolic murmur.  Few ectopics Abdomen: S/NT/ND; BS(+) Musculoskeletal:  unchanged Neurological: No new neurological deficits with right-sided weakness Extremities.  No edema Mental state: Alert, oriented, cooperative    Lab Results: BMET    Component Value Date/Time   NA 142 02/07/2018 0515   K 3.9 02/07/2018 0515   CL 110 02/07/2018 0515   CO2 22 02/07/2018 0515   GLUCOSE 103 (H) 02/07/2018 0515   BUN 26 (H) 02/07/2018 0515   CREATININE 1.51 (H) 02/07/2018 0515   CALCIUM 9.0  02/07/2018 0515   GFRNONAA 31 (L) 02/07/2018 0515   GFRAA 36 (L) 02/07/2018 0515   CBC    Component Value Date/Time   WBC 9.9 02/07/2018 0515   RBC 3.86 (L) 02/07/2018 0515   HGB 11.4 (L) 02/07/2018 0515   HCT 35.1 (L) 02/07/2018 0515   PLT 199 02/07/2018 0515   MCV 90.9 02/07/2018 0515   MCH 29.5 02/07/2018 0515   MCHC 32.5 02/07/2018 0515   RDW 12.5 02/07/2018 0515   LYMPHSABS 2.4 02/07/2018 0515   MONOABS 0.9 02/07/2018 0515   EOSABS 0.7 02/07/2018 0515   BASOSABS 0.1 02/07/2018 0515    Medications: I have reviewed the patient's current medications.  Assessment/Plan:  Functional deficits secondary to left subcortical infarct.  Continue CIR with OT and PT DVT prophylaxis continue Lovenox Essential hypertension.  Blood pressure well controlled Diabetes mellitus.  Nice glycemic control.  Fasting blood sugar today 99 Chronic kidney disease.  Stable    Length of stay, days: 5  Gordy Savers , MD 02/11/2018, 8:31 AM

## 2018-02-11 NOTE — Progress Notes (Signed)
Patient ID: Brandy Barnett, female   DOB: 11/03/1932, 84 y.o.   MRN: [id number]   Brandy Barnett is a 84 y.o. female who is admitted for comprehensive rehab with functional deficits secondary to a left subcortical infarct  Past Medical History:  Diagnosis Date  . Chronic kidney disease   . Diabetes mellitus   . Hypertension   . Iron deficiency anemia   . Low back pain   . Stroke (HCC)   . Thyroid disease      Subjective: No new complaints. No new problems.  Remains alert without concerns  Objective: Vital signs in last 24 hours: Temp:  [97.6 F (36.4 C)-98.4 F (36.9 C)] 97.6 F (36.4 C) (09/08 0447) Pulse Rate:  [78-91] 78 (09/08 0447) Resp:  [16-17] 16 (09/08 0447) BP: (131-160)/(63-84) 131/63 (09/08 0447) SpO2:  [95 %-97 %] 96 % (09/08 0447) Weight change:  Last BM Date: 02/06/18  Intake/Output from previous day: 09/07 0701 - 09/08 0700 In: 480 [P.O.:480] Out: -  Last cbgs: CBG (last 3)  Recent Labs    02/10/18 1645 02/10/18 2133 02/11/18 0632  GLUCAP 129* 167* 99   Patient Vitals for the past 24 hrs:  BP Temp Temp src Pulse Resp SpO2  02/11/18 0447 131/63 97.6 F (36.4 C) Oral 78 16 96 %  02/10/18 2046 (!) 158/71 98.4 F (36.9 C) Oral 91 16 95 %  02/10/18 1452 (!) 160/84 98.3 F (36.8 C) Oral 85 17 97 %     Physical Exam General: No apparent distress   HEENT: Unremarkable Lungs: Normal effort. Lungs clear to auscultation, no crackles or wheezes. Cardiovascular: Regular rate and rhythm, no edema; grade 2/6 systolic murmur Abdomen: S/NT/ND; BS(+) Musculoskeletal:  unchanged Neurological: No new neurological deficits Extremities.  No peripheral edema Mental state: Alert, oriented, cooperative    Lab Results: BMET    Component Value Date/Time   NA 142 02/07/2018 0515   K 3.9 02/07/2018 0515   CL 110 02/07/2018 0515   CO2 22 02/07/2018 0515   GLUCOSE 103 (H) 02/07/2018 0515   BUN 26 (H) 02/07/2018 0515   CREATININE 1.51 (H) 02/07/2018 0515   CALCIUM 9.0 02/07/2018 0515   GFRNONAA 31 (L) 02/07/2018 0515   GFRAA 36 (L) 02/07/2018 0515   CBC    Component Value Date/Time   WBC 9.9 02/07/2018 0515   RBC 3.86 (L) 02/07/2018 0515   HGB 11.4 (L) 02/07/2018 0515   HCT 35.1 (L) 02/07/2018 0515   PLT 199 02/07/2018 0515   MCV 90.9 02/07/2018 0515   MCH 29.5 02/07/2018 0515   MCHC 32.5 02/07/2018 0515   RDW 12.5 02/07/2018 0515   LYMPHSABS 2.4 02/07/2018 0515   MONOABS 0.9 02/07/2018 0515   EOSABS 0.7 02/07/2018 0515   BASOSABS 0.1 02/07/2018 0515     Medications: I have reviewed the patient's current medications.  Assessment/Plan:  Functional deficits secondary to left subcortical infarction.  Continue inpatient rehab with OT and PT Chronic kidney disease.  Stable GFR DVT prophylaxis.  Continue Lovenox Diabetes mellitus.  Remains controlled    Length of stay, days: 5  Gordy Savers , MD 02/11/2018, 8:54 AM

## 2018-02-11 NOTE — Plan of Care (Signed)
  Problem: Consults Goal: RH STROKE PATIENT EDUCATION Description See Patient Education module for education specifics  Outcome: Progressing Goal: Nutrition Consult-if indicated Outcome: Progressing Goal: Diabetes Guidelines if Diabetic/Glucose > 140 Description If diabetic or lab glucose is > 140 mg/dl - Initiate Diabetes/Hyperglycemia Guidelines & Document Interventions  Outcome: Progressing   Problem: RH BOWEL ELIMINATION Goal: RH STG MANAGE BOWEL WITH ASSISTANCE Description STG Manage Bowel with  Min Assistance.  Outcome: Progressing   Problem: RH BLADDER ELIMINATION Goal: RH STG MANAGE BLADDER WITH ASSISTANCE Description STG Manage Bladder With min Assistance  Outcome: Progressing   Problem: RH SAFETY Goal: RH STG ADHERE TO SAFETY PRECAUTIONS W/ASSISTANCE/DEVICE Description STG Adhere to Safety Precautions With  Min Assistance/Device.  Outcome: Progressing   Problem: RH COGNITION-NURSING Goal: RH STG USES MEMORY AIDS/STRATEGIES W/ASSIST TO PROBLEM SOLVE Description STG Uses Memory Aids/Strategies With mod I Assistance to Problem Solve.  Outcome: Progressing   Problem: RH KNOWLEDGE DEFICIT Goal: RH STG INCREASE KNOWLEDGE OF DIABETES Description Pt will be able to explain management of DM with medications and diet using cues/reminders Outcome: Progressing Goal: RH STG INCREASE KNOWLEDGE OF HYPERTENSION Description Pt will be able to explain management of HTn using medications and diet with cues/reminders Outcome: Progressing Goal: RH STG INCREASE KNOWLEDGE OF STROKE PROPHYLAXIS Description Pt will be able to explain stroke prevention and medications using cues\/reminders Outcome: Progressing   

## 2018-02-11 NOTE — Progress Notes (Signed)
Occupational Therapy Session Note  Patient Details  Name: Brandy Barnett MRN: [id number] Date of Birth: [birth year]  Today's Date: 02/11/2018 OT Individual Time: 7530-0511 OT Individual Time Calculation (min): 54 min    Short Term Goals: Week 1:  OT Short Term Goal 1 (Week 1): Pt will be able to complete stand pivot to toilet with steadying A. OT Short Term Goal 2 (Week 1): Pt will be able to toilet with steadying A. OT Short Term Goal 3 (Week 1): Pt will be able to bathe with steadying A and use of AE as needed. OT Short Term Goal 4 (Week 1): Pt will be able to don pullover shirt with min A. OT Short Term Goal 5 (Week 1): Pt will be able to stand without UE support with min A to enable her to use her L hand to pull her pants up.  Skilled Therapeutic Interventions/Progress Updates:    1:1. Pt reccieved in bed with no c/o pain. Pt completes stand pivot transfers with min A throughotu session and no AD with VC for hand and foot placement. Pt leans laterally onto mat on R elbow for WB and deep proprioceptive input to obtain horse shoe and throw at target. Trace evidence of tricep extension and scap protraaction noted when pushing up to sitting midline. Pt completes 2x10 modified sit ups for core and trunk control with A for bracing BLE into floor. Pt assumes sidelying to completes RUE NMR in gravicty eliminated positions. With OT applying pressure into elbow and wrist pt able to complete shoulder flex/ext, IR/ER, pro/retract, elevation/depression and elbow flex/ext 2x15 reps against manual resistance. OT facilitation scapular mobilization for NMR throughout repetitions. Pt reports liking activity saying, "that was a good workout!" Exited session with pt seated in w/c, call light in reach, tray table set up for dinner and all needs met  Therapy Documentation Precautions:  Precautions Precautions: Fall Precaution Comments: pt reports several falls Restrictions Weight Bearing Restrictions:  No General:   Vital Signs: Therapy Vitals Pulse Rate: 90 Resp: 17 BP: 123/72 Patient Position (if appropriate): Sitting Oxygen Therapy SpO2: 97 % O2 Device: Room Air Pain: Pain Assessment Pain Score: 0-No pain ADL: ADL ADL Comments: refer to functional navigator Vision See Function Navigator for Current Functional Status.   Therapy/Group: Individual Therapy  Tonny Branch 02/11/2018, 4:14 PM

## 2018-02-12 ENCOUNTER — Inpatient Hospital Stay (HOSPITAL_COMMUNITY): Payer: BLUE CROSS/BLUE SHIELD | Admitting: Occupational Therapy

## 2018-02-12 ENCOUNTER — Inpatient Hospital Stay (HOSPITAL_COMMUNITY): Payer: BLUE CROSS/BLUE SHIELD

## 2018-02-12 LAB — GLUCOSE, CAPILLARY
GLUCOSE-CAPILLARY: 150 mg/dL — AB (ref 70–99)
Glucose-Capillary: 92 mg/dL (ref 70–99)
Glucose-Capillary: 64 mg/dL — ABNORMAL LOW (ref 70–99)
Glucose-Capillary: 133 mg/dL — ABNORMAL HIGH (ref 70–99)
Glucose-Capillary: 104 mg/dL — ABNORMAL HIGH (ref 70–99)

## 2018-02-12 NOTE — Progress Notes (Signed)
Occupational Therapy Session Note  Patient Details  Name: Brandy Barnett MRN: [id number] Date of Birth: December 02, 1932  Today's Date: 02/12/2018 OT Individual Time: 4854-6270 OT Individual Time Calculation (min): 60 min    Short Term Goals: Week 1:  OT Short Term Goal 1 (Week 1): Pt will be able to complete stand pivot to toilet with steadying A. OT Short Term Goal 2 (Week 1): Pt will be able to toilet with steadying A. OT Short Term Goal 3 (Week 1): Pt will be able to bathe with steadying A and use of AE as needed. OT Short Term Goal 4 (Week 1): Pt will be able to don pullover shirt with min A. OT Short Term Goal 5 (Week 1): Pt will be able to stand without UE support with min A to enable her to use her L hand to pull her pants up.  Skilled Therapeutic Interventions/Progress Updates:    Pt received in bed. She declined a bath as she just got a shower yesterday and still had clean clothes on. Pt transferred to w/c with min A stand pivot and completed oral care at sink with set up A.  Pt taken to gym to work on LUE NMR.  Pt began in sitting working on push/pull shoulder movements while working on active grasp on dowel with A/AROM.  She then simulated stirring a pot for shoulder rotation.  In sidelying with arm supported on table, scapular protraction/retraction, sh flex to 90, forward arm push with tricep extension, and grasp/ release squeezes on towel with A/arom.  Pt has full finger flexion and 25% of extension, she also has partial tricep extension.  Pt then moved into supine for placing and holding exercises with arm over head. Pt needed proximal support, but was able to hold arm extended over head for 5 sec at a time and then actively extend arm up.  She rolled back to sidelying to sit with steadying A and then min A stand pivot to w/c.  Pt taken back to room, lap tray in place and pt in room with her son.  Therapy Documentation Precautions:  Precautions Precautions: Fall Precaution Comments: pt  reports several falls Restrictions Weight Bearing Restrictions: No   Pain: Pain Assessment Pain Scale: 0-10 Pain Score: 0-No pain ADL: ADL ADL Comments: refer to functional navigator  See Function Navigator for Current Functional Status.   Therapy/Group: Individual Therapy  Koury Roddy 02/12/2018, 8:29 AM

## 2018-02-12 NOTE — Significant Event (Signed)
Hypoglycemic Event  CBG: 133  Treatment: 15 GM carbohydrate snack  Symptoms: None  Follow-up CBG: Time:1738 CBG Result:133  Possible Reasons for Event: Medication regimen: Pyxis malfunction delayed oral medication for diabetes  Comments/MD notified:Pamela Love, PA    Brandy Barnett H

## 2018-02-12 NOTE — Progress Notes (Signed)
Subjective/Complaints: Patient sitting visiting with her son.  She states that she lives with her son.  He would like to be involved with her discharge planning.  We discussed her care team conference this Wednesday and that social work would be able to contact him after the conference Review of systems denies any chest pain shortness of breath nausea vomiting diarrhea or constipation.  Objective: Vital Signs: Blood pressure 112/64, pulse 77, temperature 98 F (36.7 C), temperature source Oral, resp. rate 17, height 5\' 2"  (1.575 m), weight 64 kg, SpO2 98 %. No results found. Results for orders placed or performed during the hospital encounter of 02/06/18 (from the past 72 hour(s))  Glucose, capillary     Status: None   Collection Time: 02/09/18 10:17 PM  Result Value Ref Range   Glucose-Capillary 89 70 - 99 mg/dL   Comment 1 Document in Chart   Glucose, capillary     Status: Abnormal   Collection Time: 02/10/18  7:07 AM  Result Value Ref Range   Glucose-Capillary 119 (H) 70 - 99 mg/dL  Glucose, capillary     Status: Abnormal   Collection Time: 02/10/18 11:29 AM  Result Value Ref Range   Glucose-Capillary 124 (H) 70 - 99 mg/dL  Glucose, capillary     Status: Abnormal   Collection Time: 02/10/18  4:45 PM  Result Value Ref Range   Glucose-Capillary 129 (H) 70 - 99 mg/dL  Glucose, capillary     Status: Abnormal   Collection Time: 02/10/18  9:33 PM  Result Value Ref Range   Glucose-Capillary 167 (H) 70 - 99 mg/dL   Comment 1 Document in Chart   Glucose, capillary     Status: None   Collection Time: 02/11/18  6:32 AM  Result Value Ref Range   Glucose-Capillary 99 70 - 99 mg/dL   Comment 1 Document in Chart   Glucose, capillary     Status: Abnormal   Collection Time: 02/11/18 11:45 AM  Result Value Ref Range   Glucose-Capillary 132 (H) 70 - 99 mg/dL  Glucose, capillary     Status: Abnormal   Collection Time: 02/11/18  4:59 PM  Result Value Ref Range   Glucose-Capillary 109 (H)  70 - 99 mg/dL  Glucose, capillary     Status: None   Collection Time: 02/11/18  9:40 PM  Result Value Ref Range   Glucose-Capillary 94 70 - 99 mg/dL  Glucose, capillary     Status: None   Collection Time: 02/12/18  6:24 AM  Result Value Ref Range   Glucose-Capillary 92 70 - 99 mg/dL  Glucose, capillary     Status: Abnormal   Collection Time: 02/12/18 11:51 AM  Result Value Ref Range   Glucose-Capillary 150 (H) 70 - 99 mg/dL  Glucose, capillary     Status: Abnormal   Collection Time: 02/12/18  5:04 PM  Result Value Ref Range   Glucose-Capillary 64 (L) 70 - 99 mg/dL     HEENT: normal Cardio: RRR and No murmur Resp: CTA B/L and Unlabored GI: BS positive and Distention Extremity:  No Edema Skin:   Intact Neuro: Alert/Oriented, Normal Sensory, Abnormal Motor 3+ in the right deltoid, bicep, tricep, grip, 4/5 in the right hip flexor knee extensor ankle dorsiflexor and Normal FMC Musc/Skel:  Normal, no pain to palpation in cervical paraspinals no pain with ROM General no acute distress, mood and affect are appropriate   Assessment/Plan: 1. Functional deficits secondary to small left subcortical infarct not noted on MRI which  require 3+ hours per day of interdisciplinary therapy in a comprehensive inpatient rehab setting. Physiatrist is providing close team supervision and 24 hour management of active medical problems listed below. Physiatrist and rehab team continue to assess barriers to discharge/monitor patient progress toward functional and medical goals. FIM: Function - Bathing Position: Shower Body parts bathed by patient: Right arm, Chest, Abdomen, Right upper leg, Left upper leg, Left lower leg Body parts bathed by helper: Right lower leg, Back, Left arm, Front perineal area, Buttocks Assist Level: (partial/moderate assist)  Function- Upper Body Dressing/Undressing What is the patient wearing?: Bra, Pull over shirt/dress Bra - Perfomed by patient: Thread/unthread left bra  strap Bra - Perfomed by helper: Thread/unthread right bra strap, Hook/unhook bra (pull down sports bra) Pull over shirt/dress - Perfomed by patient: Thread/unthread left sleeve Pull over shirt/dress - Perfomed by helper: Thread/unthread right sleeve, Put head through opening, Pull shirt over trunk Assist Level: (max assist) Function - Lower Body Dressing/Undressing What is the patient wearing?: Underwear, Pants, Non-skid slipper socks Position: Wheelchair/chair at sink Underwear - Performed by helper: Thread/unthread right underwear leg, Thread/unthread left underwear leg, Pull underwear up/down Pants- Performed by helper: Thread/unthread right pants leg, Thread/unthread left pants leg, Pull pants up/down Non-skid slipper socks- Performed by helper: Don/doff right sock, Don/doff left sock Assist for footwear: Dependant  Function - Toileting Toileting steps completed by patient: Performs perineal hygiene, Adjust clothing prior to toileting, Adjust clothing after toileting Toileting steps completed by helper: Adjust clothing prior to toileting, Performs perineal hygiene, Adjust clothing after toileting Toileting Assistive Devices: Grab bar or rail Assist level: Touching or steadying assistance (Pt.75%)  Function - Archivist transfer assistive device: Grab bar Assist level to toilet: Moderate assist (Pt 50 - 74%/lift or lower) Assist level from toilet: Moderate assist (Pt 50 - 74%/lift or lower) Assist level to bedside commode (at bedside): Moderate assist (Pt 50 - 74%/lift or lower)(per Epimenio Foot, NT and Rona Ravens, NT) Assist level from bedside commode (at bedside): Moderate assist (Pt 50 - 74%/lift or lower)  Function - Chair/bed transfer Chair/bed transfer method: Stand pivot Chair/bed transfer assist level: Touching or steadying assistance (Pt > 75%) Chair/bed transfer assistive device: Armrests Chair/bed transfer details: Verbal cues for technique, Verbal cues for  precautions/safety, Manual facilitation for weight shifting  Function - Locomotion: Wheelchair Will patient use wheelchair at discharge?: No Type: Manual Max wheelchair distance: 128ft Assist Level: Supervision or verbal cues Assist Level: Supervision or verbal cues Function - Locomotion: Ambulation Assistive device: Walker-rolling Max distance: 40 ft Assist level: Touching or steadying assistance (Pt > 75%) Assist level: Touching or steadying assistance (Pt > 75%) Walk 50 feet with 2 turns activity did not occur: Safety/medical concerns Assist level: Touching or steadying assistance (Pt > 75%) Walk 150 feet activity did not occur: Safety/medical concerns Walk 10 feet on uneven surfaces activity did not occur: Safety/medical concerns  Function - Comprehension Comprehension: Auditory Comprehension assist level: Follows basic conversation/direction with no assist  Function - Expression Expression: Verbal Expression assist level: Expresses basic needs/ideas: With no assist  Function - Social Interaction Social Interaction assist level: Interacts appropriately with others with medication or extra time (anti-anxiety, antidepressant).  Function - Problem Solving Problem solving assist level: Solves basic 90% of the time/requires cueing < 10% of the time  Function - Memory Memory assist level: Recognizes or recalls 90% of the time/requires cueing < 10% of the time Patient normally able to recall (first 3 days only): Current season, Location of own  room, Staff names and faces, That he or she is in a hospital  Medical Problem List and Plan: 1.Functional deficits due to RUE>RLE weaknesssecondary to small subcortical infarct CIR PT, OT,  2. DVT Prophylaxis/Anticoagulation: Pharmaceutical:Lovenox 3. Pain Management:Tylenol prn for HA. Tramadol prn for moderate to severe pain. Kpad and sportscreme for neck 4. Mood:LCSW to follow for evaluation and and support 5. Neuropsych: This  patientiscapable of making decisions onherown behalf. 6. Skin/Wound Care:Routine pressure relief measures 7. Fluids/Electrolytes/Nutrition:Monitor I's and O's. Check lytesin a.m. 8.HTN: Monitor: Blood pressures twice daily--has been labile. Avoid hypotension to allow for adequate perfusion. Will continue Norvasc daily Vitals:   02/12/18 0336 02/12/18 1426  BP: 123/66 112/64  Pulse: 76 77  Resp: 18 17  Temp: 98.5 F (36.9 C) 98 F (36.7 C)  SpO2: 100% 98%  Controlled 02/12/2018           9.T2DM: We will monitor blood sugars before meals at bedtime. Resume home dose Glucotrol. CBG (last 3)  Controlled 02/12/2018 Recent Labs    02/12/18 0624 02/12/18 1151 02/12/18 1704  GLUCAP 92 150* 64*  10.Chronic low back pain: Resume Cymbalta. Question tramadol as needed for pain 11.Iron deficiency anemia: Recheck CBC 12.CKD stage IV: Has improved with hydration ---DC IV fluids as intake has been good. BUN/SCr 52/1.7 at admission.GFR currently ~30  LOS (Days) 6 A FACE TO FACE EVALUATION WAS PERFORMED  Erick Colace 02/12/2018, 5:17 PM

## 2018-02-12 NOTE — Progress Notes (Signed)
Physical Therapy Session Note  Patient Details  Name: Brandy Barnett MRN: [id number] Date of Birth: 1933-02-16  Today's Date: 02/12/2018 PT Individual Time: 1000-1045 and 9977-4142 PT Individual Time Calculation (min): 45 min and 84 minutes  Short Term Goals: Week 1:  PT Short Term Goal 1 (Week 1): Pt will perform bed<>chair transfer with min assist PT Short Term Goal 2 (Week 1): Pt will ambulate x 50 ft with LRAD and min assist PT Short Term Goal 3 (Week 1): Pt will perform bed mobility with supervision PT Short Term Goal 4 (Week 1): Pt will ascend/descend 4 steps with L handrail and min assist  Skilled Therapeutic Interventions/Progress Updates:    Session 1: Pt seated in w/c upon PT arrival, agreeable to therapy tx and reports pain 8/10 in low back. Pt transported to the gym. Pt performed stand pivot to the mat with min assist. Pt performed x 10 sit<>stands from mat with L LE on 2 inch step to increase R LE loading, emphasis on R knee control and strengthening. Pt worked on standing balance with 2 inch step under L LE, performing card matching activity with min assist for balance without AD. Pt ambulated 2 x 40 ft this session with RW, R AFO and min assist, verbal cues for R heel strike and foot clearance. Pt ascended/descended 4 steps with B handrails and mod assist, step to pattern with knee hyperextension noted in R knee, therapist blocking hyperextension. Pt transported back to room and left with needs in reach, heat applied to back for pain relief.  Session 2: Pt seated in w/c upon PT arrival, agreeable to therapy tx and denies pain. Pt transported to the dayroom. Pt performed stand pivot to the nustep with min assist. Pt used nustep x 10 minutes on workload 4 for global strengthening and endurance. Pt transported to the gym. Pt ambulated x 47 ft with RW and min assist, verbal cues for increased R step length and foot clearance. Pt worked on dynamic standing balance while standing on airex  tossing horseshoes, x 2 trials with min assist for balance, no AD. Pt ambulated to side of mat with min assist and RW. Pt transferred to supine on mat with min assist. In supine pt performed LE therex for strengthening and neuro re-ed including 2 x 10 each: heel slides, SAQ, bridges, ankle pumps, hip abduction, hip flexion marches. Pt transferred to sitting with min assist. Pt ambulated x 40 ft to w/c with RW and min assist, poor hip flexion and R foot clearance, occasional R knee hyperextension still noted. Pt performed hip flexor strengthening exercises, 2 x 10 hip flexion in sitting and x 10 standing hip flexion with RW. Pt performed x 10 sit<>stands for LE strengthening and neuro re-ed, emphasis on symmetric LE weightbearing, no UE support and min assist. Pt transported back to room in w/c, left seated with needs in reach.   Therapy Documentation Precautions:  Precautions Precautions: Fall Precaution Comments: pt reports several falls Restrictions Weight Bearing Restrictions: No   See Function Navigator for Current Functional Status.   Therapy/Group: Individual Therapy  Cresenciano Genre, PT, DPT 02/12/2018, 7:51 AM

## 2018-02-13 ENCOUNTER — Inpatient Hospital Stay (HOSPITAL_COMMUNITY): Payer: BLUE CROSS/BLUE SHIELD | Admitting: Occupational Therapy

## 2018-02-13 ENCOUNTER — Inpatient Hospital Stay (HOSPITAL_COMMUNITY): Payer: BLUE CROSS/BLUE SHIELD | Admitting: Physical Therapy

## 2018-02-13 ENCOUNTER — Inpatient Hospital Stay (HOSPITAL_COMMUNITY): Payer: Self-pay

## 2018-02-13 LAB — GLUCOSE, CAPILLARY
GLUCOSE-CAPILLARY: 122 mg/dL — AB (ref 70–99)
Glucose-Capillary: 94 mg/dL (ref 70–99)
Glucose-Capillary: 125 mg/dL — ABNORMAL HIGH (ref 70–99)
Glucose-Capillary: 122 mg/dL — ABNORMAL HIGH (ref 70–99)

## 2018-02-13 NOTE — Progress Notes (Signed)
Physical Therapy Session Note  Patient Details  Name: Brandy Barnett MRN: [id number] Date of Birth: 1932/08/11  Today's Date: 02/13/2018 PT Individual Time: 0800-0855 PT Individual Time Calculation (min): 55 min   Short Term Goals: Week 1:  PT Short Term Goal 1 (Week 1): Pt will perform bed<>chair transfer with min assist PT Short Term Goal 2 (Week 1): Pt will ambulate x 50 ft with LRAD and min assist PT Short Term Goal 3 (Week 1): Pt will perform bed mobility with supervision PT Short Term Goal 4 (Week 1): Pt will ascend/descend 4 steps with L handrail and min assist  Skilled Therapeutic Interventions/Progress Updates:    Pt seated in w/c upon PT arrival, agreeable to therapy tx and denies pain. Therapist assisted pt to don shoes and R AFO. Pt transported to the gym. Pt ambulated x 40 ft with RW and min assist, verbal cues for increased R step length and heel strike, occasional knee hyperextension still noted. Pt ambulated x 40 ft with RW and min assist while kicking weighted yoga block with R LE working on swing phase and hip flexor strength. Pt performed second trial kicking weighted yoga block x 40 ft with RW with added heel wedge to R shoe in order to limit R knee hyperextension. Pt ascended/descended one 3 inch step x 5 with L handrail and min assist, verbal cues for techniques. Pt worked on sidestepping in each direction while kicking weighted yoga block for hip strengthening and NMR, using parallel bar for UE support, min assist, x 2 trials. Pt transported back to room and left seated with needs in reach and chair alarm set.   Therapy Documentation Precautions:  Precautions Precautions: Fall Precaution Comments: pt reports several falls Restrictions Weight Bearing Restrictions: No   See Function Navigator for Current Functional Status.   Therapy/Group: Individual Therapy  Cresenciano Genre, PT, DPT 02/13/2018, 7:43 AM

## 2018-02-13 NOTE — Evaluation (Signed)
Recreational Therapy Assessment and Plan  Patient Details  Name: ARVIS MIGUEZ MRN: [id number] Date of Birth: 1932-11-27 Today's Date: 02/13/2018  Rehab Potential: Good ELOS: 2 weeks Assessment  Problem List:      Patient Active Problem List   Diagnosis Date Noted  . Hyperlipidemia 02/06/2018  . Diabetes mellitus due to underlying condition with chronic kidney disease, without long-term current use of insulin ([place])   . Right hemiparesis ([place])   . Benign essential HTN   . Chronic bilateral low back pain without sciatica   . Iron deficiency anemia   . Chronic kidney disease (CKD), stage IV (severe) ([place])   . Acute ischemic stroke [facility]) s/p IV tPA 02/02/2018    Past Medical History:      Past Medical History:  Diagnosis Date  . Chronic kidney disease   . Diabetes mellitus   . Hypertension   . Iron deficiency anemia   . Low back pain   . Stroke ([place])   . Thyroid disease    Past Surgical History:       Past Surgical History:  Procedure Laterality Date  . CATARACT EXTRACTION     with lens implant for mono vision  . TOTAL ABDOMINAL HYSTERECTOMY    . TOTAL HIP ARTHROPLASTY Right 1990's    Assessment & Plan Clinical Impression: HENRIETTE HESSER is an 84 year old RH female withhistory of T2DM with CKD,hypertension, anemia; who was admitted on 01/23/2018 with transient loss of consciousness with right-sided weakness, fall and inability to walk. History taken from chart review and patient. CT of head negative and TPA administered. CTA head neck was negative for large vessel occlusion or significant stenosis. MRI brain reviewed, unremarkable for acute intracranial process. Per report, and MRA brain showed no acute intracranial process, mild to moderate chronic small vessel changes and advanced brainstem atrophy as well as basal ganglia mineralization. 2D echo done revealing EF of 60 to 10%, grade 1 diastolic dysfunction and no wall abnormality. Dr. Leonie Man felt  that patient had a small subcortical stroke not seen on MRI and patient to continue with aspirin.Therapy evaluations done revealing deficits due to elevated blood pressure, weakness and decreased coordination. CIR recommended for follow-up therapy.  Patient transferred to CIR on 02/06/2018 .    Pt presents with decreased activity tolerance, decreased functional mobility, decreased balance Limiting pt's independence with leisure/community pursuits.  Plan Min 1 TR session >20 minutes during LOS  Recommendations for other services: None   Discharge Criteria: Patient will be discharged from TR if patient refuses treatment 3 consecutive times without medical reason.  If treatment goals not met, if there is a change in medical status, if patient makes no progress towards goals or if patient is discharged from hospital.  The above assessment, treatment plan, treatment alternatives and goals were discussed and mutually agreed upon: by patient  [place] 02/13/2018, 4:12 PM

## 2018-02-13 NOTE — Progress Notes (Signed)
Physical Therapy Session Note  Patient Details  Name: Brandy Barnett MRN: [id number] Date of Birth: April 19, 1933  Today's Date: 02/13/2018 PT Individual Time: 1415-1511 PT Individual Time Calculation (min): 56 min   Short Term Goals: Week 1:  PT Short Term Goal 1 (Week 1): Pt will perform bed<>chair transfer with min assist PT Short Term Goal 2 (Week 1): Pt will ambulate x 50 ft with LRAD and min assist PT Short Term Goal 3 (Week 1): Pt will perform bed mobility with supervision PT Short Term Goal 4 (Week 1): Pt will ascend/descend 4 steps with L handrail and min assist  Skilled Therapeutic Interventions/Progress Updates:   Pt in supine and agreeable to therapy, no c/o pain. Pt requesting to toilet. Transferred to EOB w/ min assist, performed w/c and toilet transfer via stand pivot w/ min assist utilizing grab bars and bed rails. Mod assist for LE garment management and verbal/tactile cues for technique. Total assist w/c transport to/from gym for time management. Worked on standing tolerance w/o UE support while performing cognitive tasks. Able to tolerate 3-4 min at a time before needing seated rest break. Tasks emphasized trunk rotation, tactile cues for R knee muscle activation and to avoid hyperextension. Performed NuStep 10 min @ level 4 for LE strengthening and reciprocal movement pattern, verbal and tactile cues for neutral LE alignment. Returned to room and ended session in supine, call bell within reach and all needs met.   Therapy Documentation Precautions:  Precautions Precautions: Fall Precaution Comments: pt reports several falls Restrictions Weight Bearing Restrictions: No Vital Signs: Therapy Vitals Temp: 97.7 F (36.5 C) Temp Source: Oral Pulse Rate: 77 Resp: 16 BP: 134/68 Patient Position (if appropriate): Lying Oxygen Therapy SpO2: 99 % O2 Device: Room Air Pain: Pain Assessment Pain Scale: 0-10 Pain Score: 2  Pain Type: Chronic pain Pain Location: Back Pain  Orientation: Mid Pain Descriptors / Indicators: Aching Pain Frequency: Intermittent Pain Onset: Gradual Patients Stated Pain Goal: 3 Pain Intervention(s): Medication (See eMAR)  See Function Navigator for Current Functional Status.   Therapy/Group: Individual Therapy  [name] K Arnette 02/13/2018, 3:18 PM

## 2018-02-13 NOTE — Progress Notes (Signed)
Subjective/Complaints:  Discussed arm motion, uses LUE to passively move RUE, discussed importance of active movement of RUE for rehab  Review of systems denies any chest pain shortness of breath nausea vomiting diarrhea or constipation.  Objective: Vital Signs: Blood pressure 123/66, pulse 77, temperature 98 F (36.7 C), temperature source Oral, resp. rate 20, height 5\' 2"  (1.575 m), weight 64 kg, SpO2 92 %. No results found. Results for orders placed or performed during the hospital encounter of 02/06/18 (from the past 72 hour(s))  Glucose, capillary     Status: Abnormal   Collection Time: 02/10/18 11:29 AM  Result Value Ref Range   Glucose-Capillary 124 (H) 70 - 99 mg/dL  Glucose, capillary     Status: Abnormal   Collection Time: 02/10/18  4:45 PM  Result Value Ref Range   Glucose-Capillary 129 (H) 70 - 99 mg/dL  Glucose, capillary     Status: Abnormal   Collection Time: 02/10/18  9:33 PM  Result Value Ref Range   Glucose-Capillary 167 (H) 70 - 99 mg/dL   Comment 1 Document in Chart   Glucose, capillary     Status: None   Collection Time: 02/11/18  6:32 AM  Result Value Ref Range   Glucose-Capillary 99 70 - 99 mg/dL   Comment 1 Document in Chart   Glucose, capillary     Status: Abnormal   Collection Time: 02/11/18 11:45 AM  Result Value Ref Range   Glucose-Capillary 132 (H) 70 - 99 mg/dL  Glucose, capillary     Status: Abnormal   Collection Time: 02/11/18  4:59 PM  Result Value Ref Range   Glucose-Capillary 109 (H) 70 - 99 mg/dL  Glucose, capillary     Status: None   Collection Time: 02/11/18  9:40 PM  Result Value Ref Range   Glucose-Capillary 94 70 - 99 mg/dL  Glucose, capillary     Status: None   Collection Time: 02/12/18  6:24 AM  Result Value Ref Range   Glucose-Capillary 92 70 - 99 mg/dL  Glucose, capillary     Status: Abnormal   Collection Time: 02/12/18 11:51 AM  Result Value Ref Range   Glucose-Capillary 150 (H) 70 - 99 mg/dL  Glucose, capillary      Status: Abnormal   Collection Time: 02/12/18  5:04 PM  Result Value Ref Range   Glucose-Capillary 64 (L) 70 - 99 mg/dL  Glucose, capillary     Status: Abnormal   Collection Time: 02/12/18  5:38 PM  Result Value Ref Range   Glucose-Capillary 133 (H) 70 - 99 mg/dL  Glucose, capillary     Status: Abnormal   Collection Time: 02/12/18  9:28 PM  Result Value Ref Range   Glucose-Capillary 104 (H) 70 - 99 mg/dL  Glucose, capillary     Status: None   Collection Time: 02/13/18  7:11 AM  Result Value Ref Range   Glucose-Capillary 94 70 - 99 mg/dL     HEENT: normal Cardio: RRR and No murmur Resp: CTA B/L and Unlabored GI: BS positive and Distention Extremity:  No Edema Skin:   Intact Neuro: Alert/Oriented, Normal Sensory, Abnormal Motor 3+ in the right deltoid, bicep, tricep, grip, 4/5 in the right hip flexor knee extensor ankle dorsiflexor and Normal FMC Musc/Skel:  Normal, no pain to palpation in cervical paraspinals no pain with ROM General no acute distress, mood and affect are appropriate   Assessment/Plan: 1. Functional deficits secondary to small left subcortical infarct not noted on MRI which require 3+ hours per  day of interdisciplinary therapy in a comprehensive inpatient rehab setting. Physiatrist is providing close team supervision and 24 hour management of active medical problems listed below. Physiatrist and rehab team continue to assess barriers to discharge/monitor patient progress toward functional and medical goals. FIM: Function - Bathing Position: Shower Body parts bathed by patient: Right arm, Chest, Abdomen, Right upper leg, Left upper leg, Left lower leg Body parts bathed by helper: Right lower leg, Back, Left arm, Front perineal area, Buttocks Assist Level: (partial/moderate assist)  Function- Upper Body Dressing/Undressing What is the patient wearing?: Bra, Pull over shirt/dress Bra - Perfomed by patient: Thread/unthread left bra strap Bra - Perfomed by  helper: Thread/unthread right bra strap, Hook/unhook bra (pull down sports bra) Pull over shirt/dress - Perfomed by patient: Thread/unthread left sleeve Pull over shirt/dress - Perfomed by helper: Thread/unthread right sleeve, Put head through opening, Pull shirt over trunk Assist Level: (max assist) Function - Lower Body Dressing/Undressing What is the patient wearing?: Underwear, Pants, Non-skid slipper socks Position: Wheelchair/chair at sink Underwear - Performed by helper: Thread/unthread right underwear leg, Thread/unthread left underwear leg, Pull underwear up/down Pants- Performed by helper: Thread/unthread right pants leg, Thread/unthread left pants leg, Pull pants up/down Non-skid slipper socks- Performed by helper: Don/doff right sock, Don/doff left sock Assist for footwear: Dependant  Function - Toileting Toileting steps completed by patient: Performs perineal hygiene, Adjust clothing prior to toileting, Adjust clothing after toileting Toileting steps completed by helper: Adjust clothing prior to toileting, Performs perineal hygiene, Adjust clothing after toileting Toileting Assistive Devices: Grab bar or rail Assist level: Touching or steadying assistance (Pt.75%)  Function - Archivist transfer assistive device: Grab bar Assist level to toilet: Moderate assist (Pt 50 - 74%/lift or lower) Assist level from toilet: Moderate assist (Pt 50 - 74%/lift or lower) Assist level to bedside commode (at bedside): Moderate assist (Pt 50 - 74%/lift or lower)(per Epimenio Foot, NT and Rona Ravens, NT) Assist level from bedside commode (at bedside): Moderate assist (Pt 50 - 74%/lift or lower)  Function - Chair/bed transfer Chair/bed transfer method: Stand pivot Chair/bed transfer assist level: Touching or steadying assistance (Pt > 75%) Chair/bed transfer assistive device: Armrests Chair/bed transfer details: Verbal cues for technique, Verbal cues for precautions/safety, Manual  facilitation for weight shifting  Function - Locomotion: Wheelchair Will patient use wheelchair at discharge?: No Type: Manual Max wheelchair distance: 181ft Assist Level: Supervision or verbal cues Assist Level: Supervision or verbal cues Function - Locomotion: Ambulation Assistive device: Walker-rolling Max distance: 40 ft Assist level: Touching or steadying assistance (Pt > 75%) Assist level: Touching or steadying assistance (Pt > 75%) Walk 50 feet with 2 turns activity did not occur: Safety/medical concerns Assist level: Touching or steadying assistance (Pt > 75%) Walk 150 feet activity did not occur: Safety/medical concerns Walk 10 feet on uneven surfaces activity did not occur: Safety/medical concerns  Function - Comprehension Comprehension: Auditory Comprehension assist level: Follows basic conversation/direction with no assist  Function - Expression Expression: Verbal Expression assist level: Expresses basic needs/ideas: With no assist  Function - Social Interaction Social Interaction assist level: Interacts appropriately with others with medication or extra time (anti-anxiety, antidepressant).  Function - Problem Solving Problem solving assist level: Solves basic 90% of the time/requires cueing < 10% of the time  Function - Memory Memory assist level: Recognizes or recalls 90% of the time/requires cueing < 10% of the time Patient normally able to recall (first 3 days only): Current season, Location of own room, Staff names and  faces, That he or she is in a hospital  Medical Problem List and Plan: 1.Functional deficits due to RUE>RLE weaknesssecondary to small subcortical infarct CIR PT, OT,  2. DVT Prophylaxis/Anticoagulation: Pharmaceutical:Lovenox 3. Pain Management:Tylenol prn for HA. Tramadol prn for moderate to severe pain. Kpad and sportscreme for neck 4. Mood:LCSW to follow for evaluation and and support 5. Neuropsych: This patientiscapable of making  decisions onherown behalf. 6. Skin/Wound Care:Routine pressure relief measures 7. Fluids/Electrolytes/Nutrition:Monitor I's and O's. Check lytesin a.m. 8.HTN: Monitor: Blood pressures twice daily--has been labile. Avoid hypotension to allow for adequate perfusion. Will continue Norvasc daily Vitals:   02/12/18 1935 02/13/18 0626  BP: 91/62 123/66  Pulse: 76 77  Resp: 17 20  Temp: 97.8 F (36.6 C) 98 F (36.7 C)  SpO2: 97% 92%  Controlled 02/13/2018           9.T2DM: We will monitor blood sugars before meals at bedtime. Resume home dose Glucotrol. CBG (last 3)  Controlled 02/13/2018 Recent Labs    02/12/18 1738 02/12/18 2128 02/13/18 0711  GLUCAP 133* 104* 94  10.Chronic low back pain: Resume Cymbalta. Question tramadol as needed for pain 11.Iron deficiency anemia: Recheck CBC 12.CKD stage IV: Has improved with hydration ---DC IV fluids as intake has been good. BUN/SCr 52/1.7 at admission.GFR currently ~30  LOS (Days) 7 A FACE TO FACE EVALUATION WAS PERFORMED  Erick Colace 02/13/2018, 9:17 AM

## 2018-02-13 NOTE — Progress Notes (Signed)
Occupational Therapy Session Note  Patient Details  Name: Brandy Barnett MRN: [id number] Date of Birth: 07/21/1932  Today's Date: 02/13/2018 OT Individual Time: 1045-1200 OT Individual Time Calculation (min): 75 min    Short Term Goals: Week 1:  OT Short Term Goal 1 (Week 1): Pt will be able to complete stand pivot to toilet with steadying A. OT Short Term Goal 2 (Week 1): Pt will be able to toilet with steadying A. OT Short Term Goal 3 (Week 1): Pt will be able to bathe with steadying A and use of AE as needed. OT Short Term Goal 4 (Week 1): Pt will be able to don pullover shirt with min A. OT Short Term Goal 5 (Week 1): Pt will be able to stand without UE support with min A to enable her to use her L hand to pull her pants up.  Skilled Therapeutic Interventions/Progress Updates:    Pt received in bed, stating she was ready for a hot shower.  Pt transferred from supine to sit with close S.  Close S with sit to stand and min A to stand pivot to w/c and from w/c to tub bench.  Pt doffed clothing with min A.  She was able to hold a washcloth in her R hand and wash her L arm 80% with Hand over hand assist for the last 20%. Pt reached feet by crossing her legs and stood with the bar using R hand on bar to wash bottom with her L hand. Transferred back to w.c to dress.   Declined donning a bra today.  She had more difficulty with the shirt today as it was long sleeved with a tight wrist band.  Once assisted over her L hand, pt donned shirt with verbal cues.   She was able to don pants by crossing legs and then stood with min A to pull pants over hips with min A.  Pt fatigued at this point, so donned socks for the pt.   Pt then worked on [employer] with a/arom exercises using a UE ranger and active pinch with clothespins. Pt set up with lunch tray.  All needs met. Call light in reach.  Therapy Documentation Precautions:  Precautions Precautions: Fall Precaution Comments: pt reports several  falls Restrictions Weight Bearing Restrictions: No   Pain: Pain Assessment Pain Scale: 0-10 Pain Score: 8  Pain Type: Chronic pain Pain Location: Back Pain Orientation: Mid Pain Descriptors / Indicators: Aching Pain Frequency: Intermittent Pain Onset: Gradual Patients Stated Pain Goal: 3 Pain Intervention(s): Medication (See eMAR) ADL: ADL ADL Comments: refer to functional navigator    See Function Navigator for Current Functional Status.   Therapy/Group: Individual Therapy  [place] 02/13/2018, 12:53 PM

## 2018-02-13 NOTE — Plan of Care (Signed)
  Problem: Consults Goal: RH STROKE PATIENT EDUCATION Description See Patient Education module for education specifics  Outcome: Progressing Goal: Nutrition Consult-if indicated Outcome: Progressing Goal: Diabetes Guidelines if Diabetic/Glucose > 140 Description If diabetic or lab glucose is > 140 mg/dl - Initiate Diabetes/Hyperglycemia Guidelines & Document Interventions  Outcome: Progressing   Problem: RH BOWEL ELIMINATION Goal: RH STG MANAGE BOWEL WITH ASSISTANCE Description STG Manage Bowel with  Min Assistance.  Outcome: Progressing   Problem: RH BLADDER ELIMINATION Goal: RH STG MANAGE BLADDER WITH ASSISTANCE Description STG Manage Bladder With min Assistance  Outcome: Progressing   Problem: RH SAFETY Goal: RH STG ADHERE TO SAFETY PRECAUTIONS W/ASSISTANCE/DEVICE Description STG Adhere to Safety Precautions With  Min Assistance/Device.  Outcome: Progressing   Problem: RH COGNITION-NURSING Goal: RH STG USES MEMORY AIDS/STRATEGIES W/ASSIST TO PROBLEM SOLVE Description STG Uses Memory Aids/Strategies With mod I Assistance to Problem Solve.  Outcome: Progressing   Problem: RH KNOWLEDGE DEFICIT Goal: RH STG INCREASE KNOWLEDGE OF DIABETES Description Pt will be able to explain management of DM with medications and diet using cues/reminders Outcome: Progressing Goal: RH STG INCREASE KNOWLEDGE OF HYPERTENSION Description Pt will be able to explain management of HTn using medications and diet with cues/reminders Outcome: Progressing Goal: RH STG INCREASE KNOWLEDGE OF STROKE PROPHYLAXIS Description Pt will be able to explain stroke prevention and medications using cues\/reminders Outcome: Progressing   

## 2018-02-14 ENCOUNTER — Inpatient Hospital Stay (HOSPITAL_COMMUNITY): Payer: BLUE CROSS/BLUE SHIELD | Admitting: Physical Therapy

## 2018-02-14 ENCOUNTER — Inpatient Hospital Stay (HOSPITAL_COMMUNITY): Payer: Self-pay

## 2018-02-14 ENCOUNTER — Inpatient Hospital Stay (HOSPITAL_COMMUNITY): Payer: BLUE CROSS/BLUE SHIELD | Admitting: Occupational Therapy

## 2018-02-14 LAB — CBC
HCT: 31.9 % — ABNORMAL LOW (ref 36.0–46.0)
HEMOGLOBIN: 10.3 g/dL — AB (ref 12.0–15.0)
MCH: 29.6 pg (ref 26.0–34.0)
MCHC: 32.3 g/dL (ref 30.0–36.0)
MCV: 91.7 fL (ref 78.0–100.0)
PLATELETS: 213 10*3/uL (ref 150–400)
RBC: 3.48 MIL/uL — AB (ref 3.87–5.11)
RDW: 12.4 % (ref 11.5–15.5)
WBC: 9.7 10*3/uL (ref 4.0–10.5)

## 2018-02-14 LAB — BASIC METABOLIC PANEL
ANION GAP: 10 (ref 5–15)
BUN: 58 mg/dL — ABNORMAL HIGH (ref 8–23)
CALCIUM: 8.9 mg/dL (ref 8.9–10.3)
CO2: 22 mmol/L (ref 22–32)
CREATININE: 2.1 mg/dL — AB (ref 0.44–1.00)
Chloride: 111 mmol/L (ref 98–111)
GFR, EST AFRICAN AMERICAN: 24 mL/min — AB (ref >60)
GFR, EST NON AFRICAN AMERICAN: 21 mL/min — AB (ref >60)
Glucose, Bld: 99 mg/dL (ref 70–99)
Potassium: 4.3 mmol/L (ref 3.5–5.1)
Sodium: 143 mmol/L (ref 135–145)

## 2018-02-14 LAB — GLUCOSE, CAPILLARY
GLUCOSE-CAPILLARY: 105 mg/dL — AB (ref 70–99)
GLUCOSE-CAPILLARY: 146 mg/dL — AB (ref 70–99)
GLUCOSE-CAPILLARY: 98 mg/dL (ref 70–99)
Glucose-Capillary: 106 mg/dL — ABNORMAL HIGH (ref 70–99)

## 2018-02-14 MED ORDER — SODIUM CHLORIDE 0.45 % IV SOLN
INTRAVENOUS | Status: DC
  Administered 2018-02-14: 19:00:00 via INTRAVENOUS

## 2018-02-14 NOTE — Progress Notes (Signed)
Physical Therapy Session Note  Patient Details  Name: Brandy Barnett MRN: [id number] Date of Birth: [birth year]  Today's Date: 02/14/2018 PT Individual Time: 1500-1526 PT Individual Time Calculation (min): 26 min   Short Term Goals: Week 2:  PT Short Term Goal 1 (Week 2): STG=LTG due to ELOS  Skilled Therapeutic Interventions/Progress Updates:    Patient received in bed, pleasant and willing to work with PT today. She is able to complete functional bed mobility with MinA, and stand-pivot transfers with MinA this afternoon. TotalA for application of shoes and AFO while sitting at EOB. She was able to gait train 83f x2 with RW and min guard, Mod cues for progression of L LE and swing through of L LE as it appeared to be dragging more this afternoon likely due to high levels of fatigue as reported by patient. She was returned to her room totalA via WHawardento stand-pivot back to bed and S for return to supine. She was left in bed with alarm activated, heat pack on, all needs otherwise met this afternoon.   Therapy Documentation Precautions:  Precautions Precautions: Fall Precaution Comments: pt reports several falls Restrictions Weight Bearing Restrictions: No General:   Vital Signs:   Pain: Pain Assessment Pain Scale: Faces Faces Pain Scale: Hurts little more Pain Type: Chronic pain Pain Location: Back Pain Orientation: Mid Pain Descriptors / Indicators: Aching;Sore Pain Onset: On-going   See Function Navigator for Current Functional Status.   Therapy/Group: Individual Therapy   KDeniece ReePT, DPT, CBIS  Supplemental Physical Therapist [facility]   Pager [phone] Rehab Office [phone]   02/14/2018, 3:41 PM

## 2018-02-14 NOTE — Plan of Care (Signed)
  Problem: Consults Goal: RH STROKE PATIENT EDUCATION Description See Patient Education module for education specifics  Outcome: Progressing Goal: Nutrition Consult-if indicated Outcome: Progressing Goal: Diabetes Guidelines if Diabetic/Glucose > 140 Description If diabetic or lab glucose is > 140 mg/dl - Initiate Diabetes/Hyperglycemia Guidelines & Document Interventions  Outcome: Progressing   Problem: RH BOWEL ELIMINATION Goal: RH STG MANAGE BOWEL WITH ASSISTANCE Description STG Manage Bowel with  Min Assistance.  Outcome: Progressing   Problem: RH BLADDER ELIMINATION Goal: RH STG MANAGE BLADDER WITH ASSISTANCE Description STG Manage Bladder With min Assistance  Outcome: Progressing   Problem: RH SAFETY Goal: RH STG ADHERE TO SAFETY PRECAUTIONS W/ASSISTANCE/DEVICE Description STG Adhere to Safety Precautions With  Min Assistance/Device.  Outcome: Progressing   Problem: RH COGNITION-NURSING Goal: RH STG USES MEMORY AIDS/STRATEGIES W/ASSIST TO PROBLEM SOLVE Description STG Uses Memory Aids/Strategies With mod I Assistance to Problem Solve.  Outcome: Progressing   Problem: RH KNOWLEDGE DEFICIT Goal: RH STG INCREASE KNOWLEDGE OF DIABETES Description Pt will be able to explain management of DM with medications and diet using cues/reminders Outcome: Progressing Goal: RH STG INCREASE KNOWLEDGE OF HYPERTENSION Description Pt will be able to explain management of HTn using medications and diet with cues/reminders Outcome: Progressing Goal: RH STG INCREASE KNOWLEDGE OF STROKE PROPHYLAXIS Description Pt will be able to explain stroke prevention and medications using cues\/reminders Outcome: Progressing   

## 2018-02-14 NOTE — Progress Notes (Signed)
Physical Therapy Session Note  Patient Details  Name: Brandy Barnett MRN: [id number] Date of Birth: February 07, 1933  Today's Date: 02/14/2018 PT Individual Time: 1345-1427 PT Individual Time Calculation (min): 42 min   Short Term Goals: Week 1:  PT Short Term Goal 1 (Week 1): Pt will perform bed<>chair transfer with min assist PT Short Term Goal 1 - Progress (Week 1): Met PT Short Term Goal 2 (Week 1): Pt will ambulate x 50 ft with LRAD and min assist PT Short Term Goal 2 - Progress (Week 1): Met PT Short Term Goal 3 (Week 1): Pt will perform bed mobility with supervision PT Short Term Goal 3 - Progress (Week 1): Met PT Short Term Goal 4 (Week 1): Pt will ascend/descend 4 steps with L handrail and min assist PT Short Term Goal 4 - Progress (Week 1): Progressing toward goal  Skilled Therapeutic Interventions/Progress Updates: Pt presented in bed sleeping but easily aroused and agreeable to therapy. Pt performed supine to sit with minA and PTA donned shoes/AFO total A. Pt requesting to use toilet. Performed stand pivot to w/c CGA and transported to toilet where pt performed stand pivot with wall rail. PTA provided total A for LB clothing management (+void) and pt returned to w/c in same manner. Pt transported to [employer] and participated in R NMR activities. Pt performed seated knee flexion, standing knee flexion to target.  Attempted standing knee flexion to colored cone however pt unable to clear foot to remove cone. When attempted from lower small orange cone pt was able to perform bout x 5 and cone was able to stay upright. Pt did require intermittent rest breaks due to increasing back pain. Pt ambulated 19f x 1 with CGA with intermittent bouts of poor to no RLE foot clearance. Pt performed stand pivot transfers throughout session with CGA however required frequent mod cues for safety with hand placement. Pt propelled approx 1040fback to room with only LLE and performed stand pivot transfer to bed in  same manner as noted above. Pt performed sit to supine with minA for RLE placement and was left in bed with alarm on, call bell within reach and current needs met.      Therapy Documentation Precautions:  Precautions Precautions: Fall Precaution Comments: pt reports several falls Restrictions Weight Bearing Restrictions: No General:   Vital Signs: Therapy Vitals Temp: 97.9 F (36.6 C) Temp Source: Oral Pulse Rate: 79 Resp: 12 BP: (!) 141/70 Patient Position (if appropriate): Lying Oxygen Therapy SpO2: 95 % O2 Device: Room Air Pain: Pain Assessment Pain Scale: Faces Faces Pain Scale: Hurts little more Pain Type: Chronic pain Pain Location: Back Pain Orientation: Mid Pain Descriptors / Indicators: Aching;Sore Pain Onset: On-going   See Function Navigator for Current Functional Status.   Therapy/Group: Individual Therapy  Cheris Tweten  Louise Victory, PTA  02/14/2018, 3:59 PM

## 2018-02-14 NOTE — Progress Notes (Signed)
Social Work  Deoni Cosey, Elveria Rising  Social Worker  Physical Medicine and Rehabilitation  Patient Care Conference  Cosign Needed  Date of Service:  02/14/2018  3:49 PM          Cosign Needed        Show:Clear all [x] Manual[x] Template[] Copied  Added by: [x] Thresea Doble, Lemar Livings, LCSW  [] Hover for details Inpatient RehabilitationTeam Conference and Plan of Care Update Date: 02/14/2018   Time: 11:15 AM      Patient Name: Brandy Barnett      Medical Record Number: [id number]  Date of Birth: February 25, 1933 Sex: Female         Room/Bed: 4W21C/4W21C-01 Payor Info: Payor: MEDICARE / Plan: MEDICARE PART A AND B / Product Type: *No Product type* /     Admitting Diagnosis: small subcortical intarct wih residual hemiplegia  Admit Date/Time:  02/06/2018  2:13 PM Admission Comments: No comment available    Primary Diagnosis:  <principal problem not specified> Principal Problem: <principal problem not specified>       Patient Active Problem List    Diagnosis Date Noted  . Hyperlipidemia 02/06/2018  . 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 Right hemiparesis (HCC)    . Benign essential HTN    . Chronic bilateral low back pain without sciatica    . Iron deficiency anemia    . Chronic kidney disease (CKD), stage IV (severe) (HCC)    . Acute ischemic stroke [facility]) s/p IV tPA 02/02/2018      Expected Discharge Date: Expected Discharge Date: 02/22/18   Team Members Present: Physician leading conference: Dr. Claudette Laws Social Worker Present: Dossie Der, LCSW Nurse Present: Ronny Bacon, RN PT Present: Woodfin Ganja, PT OT Present: Roney Mans, OT SLP Present: Feliberto Gottron, SLP PPS Coordinator present : Edson Snowball, PT       Current Status/Progress Goal Weekly Team Focus  Medical     Mod I UE dressing, poor usage of RUE, needs reinforcement  reduced fall risk, reduce knee hyperext   COnt rehab   Bowel/Bladder     continent of B & B, LBM 9/9  pt will remain continent with normal bowel pattern  continue to monitor   Swallow/Nutrition/ Hydration               ADL's     min A standing balance, min A toileting and LB dressing, min A bathing and UB dressing, improving use of RUE as a stabilizing A  Mod I with standing with AD, mod I toileting, S bathing and dressing  ADL training, RUE NMR, balance, functional mobility, pt/family education   Mobility     min assist overall, gait 40' w/ RW  supervision   progressing gait, functional balance, all functional mobility, endurance, RLE NMR   Communication               Safety/Cognition/ Behavioral Observations             Pain     no c/o pain, has tylenol & tramadol prn  pt will be pain free  assess & treat as needed   Skin     various areas of bruising, no skin break down  pt remains free of skin breakdown and infection  assess q shift     *See Care Plan and progress notes for long and short-term goals.      Barriers to Discharge  Current Status/Progress Possible Resolutions Date Resolved   Physician     Decreased caregiver support;Medical stability;Other (comments)  orthotic eval pending  Cont rehab, orthotic consult  Cont rehab      Nursing                 PT  Decreased caregiver support  Pt does not want to burden her children with her care              OT                 SLP            SW              Discharge Planning/Teaching Needs:  HOme with son who is retired but in and out. Making good progress in therapies, will ask son to come in and attend therapies with pt.      Team Discussion:  Progressing toward her goals of supervision-mod/i level. Balance issues and reinforcement to use affected side. Mod assist -steps. Order for AFO. IV fluids tonight need to encourage fluids. See is son can provide this level of care. Have come in next week.  Revisions to Treatment Plan:  DC 9/19    Continued Need  for Acute Rehabilitation Level of Care: The patient requires daily medical management by a physician with specialized training in physical medicine and rehabilitation for the following conditions: Daily direction of a multidisciplinary physical rehabilitation program to ensure safe treatment while eliciting the highest outcome that is of practical value to the patient.: Yes Daily medical management of patient stability for increased activity during participation in an intensive rehabilitation regime.: Yes Daily analysis of laboratory values and/or radiology reports with any subsequent need for medication adjustment of medical intervention for : Post surgical problems;Neurological problems     I attest that I was present, lead the team conference, and concur with the assessment and plan of the team.     Lucy Chris 02/14/2018, 3:49 PM                Kalii Chesmore, Lemar Livings, LCSW  Social Worker  Physical Medicine and Rehabilitation  Patient Care Conference  Signed  Date of Service:  02/07/2018  3:18 PM          Signed        Show:Clear all [x] Manual[x] Template[] Copied  Added by: [x] Lakishia Bourassa, Lemar Livings, LCSW  [] Hover for details Inpatient RehabilitationTeam Conference and Plan of Care Update Date: 02/07/2018   Time: 11:30 AM      Patient Name: Brandy Barnett      Medical Record Number: [id number]  Date of Birth: [birth year] Sex: Female         Room/Bed: 4W21C/4W21C-01 Payor Info: Payor: MEDICARE / Plan: MEDICARE PART A AND B / Product Type: *No Product type* /     Admitting Diagnosis: small subcortical intarct wih residual hemiplegia  Admit Date/Time:  02/06/2018  2:13 PM Admission Comments: No comment available    Primary Diagnosis:  <principal problem not specified> Principal Problem: <principal problem not specified>       Patient Active Problem List    Diagnosis Date Noted  . Hyperlipidemia 02/06/2018  . Diabetes mellitus due to underlying condition with chronic kidney  disease, without long-term current use of insulin (HCC)    . Right hemiparesis (HCC)    . Benign essential HTN    . Chronic bilateral low back pain without sciatica    . Iron  deficiency anemia    . Chronic kidney disease (CKD), stage IV (severe) (HCC)    . Acute ischemic stroke [facility]) s/p IV tPA 02/02/2018      Expected Discharge Date:     Team Members Present: Physician leading conference: Dr. Claudette Laws Social Worker Present: Dossie Der, LCSW Nurse Present: Chana Bode, RN PT Present: Woodfin Ganja, PT OT Present: Ardis Rowan, COTA;Jennifer Katrinka Blazing, OT SLP Present: Feliberto Gottron, SLP PPS Coordinator present : Tora Duck, RN, CRRN       Current Status/Progress Goal Weekly Team Focus  Medical     Right upper greater than lower extremity weakness status post CVA not apparent on imaging studies, blood pressure is labile and elevated at times  Adequate blood pressure management, reduce fall risk, reduce readmission risk  Initiate rehabilitation program   Bowel/Bladder     (P) Incontinent of Bladder and continent of bowel; LBM 02/04/18  (P) Remains continent of bladder and bowel  (P) Assess bladder and bowel q shift and toileted as needed   Swallow/Nutrition/ Hydration               ADL's       eval pending        Mobility       eval pending        Communication               Safety/Cognition/ Behavioral Observations             Pain     (P) No complaints of pain   (P) pain < or =2  (P) Assess pain q shift and provided prn medications as needed   Skin     Ecchymosis and bruising to rt anterior arm and posterior occipital;; bruising to rt hip   Patient remains free of skin breakdown and infection  Assess skin q shift and as needed and prevent further breakdown     *See Care Plan and progress notes for long and short-term goals.      Barriers to Discharge   Current Status/Progress Possible Resolutions Date Resolved   Physician     Medical stability      Initiating PT OT  See above continue medical management on daily basis      Nursing                 PT                    OT Decreased caregiver support  pt will need to be home alone for short intervals during the day totaling approximately 3 hours             SLP            SW              Discharge Planning/Teaching Needs:    Home with son and daughter in-law who can provide evenings and intermittent assist during the day. Son is retired.     Team Discussion:  New patient setting goals and LOS. Currently mod assist level. Knee is hyperextending in therapy. MD addressing medical issues.  Revisions to Treatment Plan:  New eval    Continued Need for Acute Rehabilitation Level of Care: The patient requires daily medical management by a physician with specialized training in physical medicine and rehabilitation for the following conditions: Daily direction of a multidisciplinary physical rehabilitation program to ensure safe treatment while eliciting the highest outcome that is of practical  value to the patient.: Yes Daily medical management of patient stability for increased activity during participation in an intensive rehabilitation regime.: Yes Daily analysis of laboratory values and/or radiology reports with any subsequent need for medication adjustment of medical intervention for : Neurological problems     I attest that I was present, lead the team conference, and concur with the assessment and plan of the team. Dr. Claudette Laws   Lucy Chris 02/07/2018, 3:18 PM               Patient ID: [name], female   DOB: [birth year], 84 y.o.   MRN: [id number]

## 2018-02-14 NOTE — Progress Notes (Signed)
Occupational Therapy Weekly Progress Note  Patient Details  Name: Brandy Barnett MRN: [id number] Date of Birth: July 15, 1932  Beginning of progress report period: February 07, 2018 End of progress report period: February 14, 2018  Today's Date: 02/14/2018 OT Individual Time: 1100-1155 OT Individual Time Calculation (min): 55 min    Patient has met 4 of 5 short term goals.  Pt has been making excellent progress with her balance, transfers and use of her RUE.  Overall pt is at a min A level to steadying A level compared to the mod A she needed at admission.  The focus this next week will be on her tranfers and balance to enable her to be able to toilet with mod I as her family is not always home.  She does have frequent neck and back pain which limits her at times.   Patient continues to demonstrate the following deficits: muscle weakness, decreased cardiorespiratoy endurance, unbalanced muscle activation and decreased coordination and decreased standing balance, decreased postural control and hemiplegia and therefore will continue to benefit from skilled OT intervention to enhance overall performance with BADL.  Patient progressing toward long term goals..  Continue plan of care.  OT Short Term Goals Week 1:  OT Short Term Goal 1 (Week 1): Pt will be able to complete stand pivot to toilet with steadying A. OT Short Term Goal 1 - Progress (Week 1): Met OT Short Term Goal 2 (Week 1): Pt will be able to toilet with steadying A. OT Short Term Goal 2 - Progress (Week 1): Met OT Short Term Goal 3 (Week 1): Pt will be able to bathe with steadying A and use of AE as needed. OT Short Term Goal 3 - Progress (Week 1): Progressing toward goal OT Short Term Goal 4 (Week 1): Pt will be able to don pullover shirt with min A. OT Short Term Goal 4 - Progress (Week 1): Met OT Short Term Goal 5 (Week 1): Pt will be able to stand without UE support with min A to enable her to use her L hand to pull her pants  up. OT Short Term Goal 5 - Progress (Week 1): Met Week 2:  OT Short Term Goal 1 (Week 2): STGs = LTGs  Skilled Therapeutic Interventions/Progress Updates:    Pt received in w/c requesting to use the bathroom and don new clothing.  Pt used her RW to ambulate to toilet and back with min A.  Pt was able to pull pants down and up with less than 10 % A over R hip.  Pt able to cleanse self.   Pt returned to w/c to dress. Because she had a short sleeved shirt, she was able to don with S.   Pants she again only needed a small amount of help over her hips.  Placed shoe buttons in shoes so pt could fasten them herself. She does need A with AFO brace.   Pt worked on A/arom of R shoulder and elbow with bimanual assist with B hands on a dowel bar.   Pt set up with lunch tray and all needs met.  Therapy Documentation Precautions:  Precautions Precautions: Fall Precaution Comments: pt reports several falls Restrictions Weight Bearing Restrictions: No   Pain: Pt c/o upper neck pain - provided pt with a disposable hot pack to use on back of neck while she was eating lunch. ADL: ADL ADL Comments: refer to functional navigator  See Function Navigator for Current Functional Status.   Therapy/Group: Individual  Therapy  SAGUIER,JULIA 02/14/2018, 12:31 PM

## 2018-02-14 NOTE — Progress Notes (Signed)
Social Work Patient ID: Brandy Barnett, female   DOB: 1932/11/04, 84 y.o.   MRN: [id number]   Met with pt and spoke with son via telephone to discuss team conference goals supervision-min assist level and target discharge 9/19. Son wants to make sure she can move around the house and go to the bathroom on her own before she comes home. He doesn't feel he can help her with that. He can assist with the stairs. Have asked for him to coem in next week and attend PT session to see pt move and make sure this is realistic for home. Pt had mentioned going to a NH before going home due to she doesn't want to burden her son. Will work on best plan and make sure son can manage pt at home next week.

## 2018-02-14 NOTE — Progress Notes (Signed)
Physical Therapy Weekly Progress Note  Patient Details  Name: Brandy Barnett MRN: [id number] Date of Birth: [birth year]  Beginning of progress report period: February 07, 2018 End of progress report period: February 14, 2018  Today's Date: 02/14/2018 PT Individual Time: 2409-7353 PT Individual Time Calculation (min): 58 min   Patient has met 3 of 4 short term goals. Pt is progressing with all functional mobility, she currently requires min assist for gait up to 60 ft and transfers from w/c<>bed. She did not meet her stair goal as she requires moderate assist to ascend/descend stairs secondary to poor R knee control and LE weakness. Continue physical therapy for strengthening, balance and R UE/LE neuro muscular re-education.   Patient continues to demonstrate the following deficits muscle weakness, decreased coordination and decreased standing balance, hemiplegia and decreased balance strategies and therefore will continue to benefit from skilled PT intervention to increase functional independence with mobility.  Patient progressing toward long term goals..  Continue plan of care.  PT Short Term Goals Week 1:  PT Short Term Goal 1 (Week 1): Pt will perform bed<>chair transfer with min assist PT Short Term Goal 1 - Progress (Week 1): Met PT Short Term Goal 2 (Week 1): Pt will ambulate x 50 ft with LRAD and min assist PT Short Term Goal 2 - Progress (Week 1): Met PT Short Term Goal 3 (Week 1): Pt will perform bed mobility with supervision PT Short Term Goal 3 - Progress (Week 1): Met PT Short Term Goal 4 (Week 1): Pt will ascend/descend 4 steps with L handrail and min assist PT Short Term Goal 4 - Progress (Week 1): Progressing toward goal Week 2:  PT Short Term Goal 1 (Week 2): STG=LTG due to ELOS  Skilled Therapeutic Interventions/Progress Updates:    Pt seated EOB upon PT arrival, agreeable to therapy tx and denies pain. Pt performed stand pivot to w/c with min assist. Pt sitting in w/c at  the sink brushed teeth, requiring set up assist. Pt transported to the gym. Pt ambulated x 60 ft with RW and min assist, verbal cues for increased R step length and foot clearance. Pt ambulated x 50 ft while kicking weighted yoga block using RW and min assist working on hip flexion and increased step length. Pt performed standing x 10 hip flexion marches with R LE working on [employer] and strengthening. Pt performed x 3 and x 4 toe taps on 4 inch step with R LE working on hip flexion and NMR, RW for UE support. Pt performed 2 x 10 sit<>stands from mat without UE support for strengthening and R knee control, min assist and manual facilitation for increased WB through R LE. Pt ambulated x 40 ft back to chair with RW and min assist, verbal cues for R LE placement especially with turns. Pt worked on standing balance on airex while tossing horseshoes, min assist for balance and pt requiring verbal/tactile cues to correct posterior lean. Pt transported back to room and left seated in w/c with needs in reach.   Therapy Documentation Precautions:  Precautions Precautions: Fall Precaution Comments: pt reports several falls Restrictions Weight Bearing Restrictions: No   See Function Navigator for Current Functional Status.  Therapy/Group: Individual Therapy  [name], PT, DPT 02/14/2018, 8:02 AM

## 2018-02-14 NOTE — Progress Notes (Signed)
Subjective/Complaints:  No issues overnite, son at bedside discussed SW to call him after care team  Review of systems denies any chest pain shortness of breath nausea vomiting diarrhea or constipation.  Objective: Vital Signs: Blood pressure 123/77, pulse 77, temperature 98 F (36.7 C), resp. rate 16, height '5\' 2"'$  (1.575 m), weight 64 kg, SpO2 99 %. No results found. Results for orders placed or performed during the hospital encounter of 02/06/18 (from the past 72 hour(s))  Glucose, capillary     Status: Abnormal   Collection Time: 02/11/18 11:45 AM  Result Value Ref Range   Glucose-Capillary 132 (H) 70 - 99 mg/dL  Glucose, capillary     Status: Abnormal   Collection Time: 02/11/18  4:59 PM  Result Value Ref Range   Glucose-Capillary 109 (H) 70 - 99 mg/dL  Glucose, capillary     Status: None   Collection Time: 02/11/18  9:40 PM  Result Value Ref Range   Glucose-Capillary 94 70 - 99 mg/dL  Glucose, capillary     Status: None   Collection Time: 02/12/18  6:24 AM  Result Value Ref Range   Glucose-Capillary 92 70 - 99 mg/dL  Glucose, capillary     Status: Abnormal   Collection Time: 02/12/18 11:51 AM  Result Value Ref Range   Glucose-Capillary 150 (H) 70 - 99 mg/dL  Glucose, capillary     Status: Abnormal   Collection Time: 02/12/18  5:04 PM  Result Value Ref Range   Glucose-Capillary 64 (L) 70 - 99 mg/dL  Glucose, capillary     Status: Abnormal   Collection Time: 02/12/18  5:38 PM  Result Value Ref Range   Glucose-Capillary 133 (H) 70 - 99 mg/dL  Glucose, capillary     Status: Abnormal   Collection Time: 02/12/18  9:28 PM  Result Value Ref Range   Glucose-Capillary 104 (H) 70 - 99 mg/dL  Glucose, capillary     Status: None   Collection Time: 02/13/18  7:11 AM  Result Value Ref Range   Glucose-Capillary 94 70 - 99 mg/dL  Glucose, capillary     Status: Abnormal   Collection Time: 02/13/18 11:52 AM  Result Value Ref Range   Glucose-Capillary 125 (H) 70 - 99 mg/dL   Glucose, capillary     Status: Abnormal   Collection Time: 02/13/18  4:35 PM  Result Value Ref Range   Glucose-Capillary 122 (H) 70 - 99 mg/dL  Glucose, capillary     Status: Abnormal   Collection Time: 02/13/18 10:08 PM  Result Value Ref Range   Glucose-Capillary 122 (H) 70 - 99 mg/dL  Basic metabolic panel     Status: Abnormal   Collection Time: 02/14/18  5:00 AM  Result Value Ref Range   Sodium 143 135 - 145 mmol/L   Potassium 4.3 3.5 - 5.1 mmol/L   Chloride 111 98 - 111 mmol/L   CO2 22 22 - 32 mmol/L   Glucose, Bld 99 70 - 99 mg/dL   BUN 58 (H) 8 - 23 mg/dL   Creatinine, Ser 2.10 (H) 0.44 - 1.00 mg/dL   Calcium 8.9 8.9 - 10.3 mg/dL   GFR calc non Af Amer 21 (L) >60 mL/min   GFR calc Af Amer 24 (L) >60 mL/min    Comment: (NOTE) The eGFR has been calculated using the CKD EPI equation. This calculation has not been validated in all clinical situations. eGFR's persistently <60 mL/min signify possible Chronic Kidney Disease.    Anion gap 10 5 -  15    Comment: Performed at [facility], [place] [address], [place], [place] [address]  CBC     Status: Abnormal   Collection Time: 02/14/18  5:00 AM  Result Value Ref Range   WBC 9.7 4.0 - 10.5 K/uL   RBC 3.48 (L) 3.87 - 5.11 MIL/uL   Hemoglobin 10.3 (L) 12.0 - 15.0 g/dL   HCT 31.9 (L) 36.0 - 46.0 %   MCV 91.7 78.0 - 100.0 fL   MCH 29.6 26.0 - 34.0 pg   MCHC 32.3 30.0 - 36.0 g/dL   RDW 12.4 11.5 - 15.5 %   Platelets 213 150 - 400 K/uL    Comment: Performed at [facility], [place] [address], [place], [place] [address]  Glucose, capillary     Status: Abnormal   Collection Time: 02/14/18  7:06 AM  Result Value Ref Range   Glucose-Capillary 105 (H) 70 - 99 mg/dL     HEENT: normal Cardio: RRR and No murmur Resp: CTA B/L and Unlabored GI: BS positive and Distention Extremity:  No Edema Skin:   Intact Neuro: Alert/Oriented, Normal Sensory, Abnormal Motor 3+ in the right deltoid, bicep, tricep, grip, 4/5 in the right  hip flexor knee extensor ankle dorsiflexor and Normal FMC Musc/Skel:  Normal, no pain to palpation in cervical paraspinals no pain with ROM General no acute distress, mood and affect are appropriate   Assessment/Plan: 1. Functional deficits secondary to small left subcortical infarct not noted on MRI which require 3+ hours per day of interdisciplinary therapy in a comprehensive inpatient rehab setting. Physiatrist is providing close team supervision and 24 hour management of active medical problems listed below. Physiatrist and rehab team continue to assess barriers to discharge/monitor patient progress toward functional and medical goals. FIM: Function - Bathing Position: Shower Body parts bathed by patient: Right arm, Chest, Abdomen, Right upper leg, Left upper leg, Left lower leg Body parts bathed by helper: Right lower leg, Back, Left arm, Front perineal area, Buttocks Assist Level: (partial/moderate assist)  Function- Upper Body Dressing/Undressing What is the patient wearing?: Bra, Pull over shirt/dress Bra - Perfomed by patient: Thread/unthread left bra strap Bra - Perfomed by helper: Thread/unthread right bra strap, Hook/unhook bra (pull down sports bra) Pull over shirt/dress - Perfomed by patient: Thread/unthread left sleeve Pull over shirt/dress - Perfomed by helper: Thread/unthread right sleeve, Put head through opening, Pull shirt over trunk Assist Level: (max assist) Function - Lower Body Dressing/Undressing What is the patient wearing?: Underwear, Pants, Non-skid slipper socks Position: Wheelchair/chair at sink Underwear - Performed by helper: Thread/unthread right underwear leg, Thread/unthread left underwear leg, Pull underwear up/down Pants- Performed by helper: Thread/unthread right pants leg, Thread/unthread left pants leg, Pull pants up/down Non-skid slipper socks- Performed by helper: Don/doff right sock, Don/doff left sock Assist for footwear: Dependant  Function  - Toileting Toileting steps completed by patient: Performs perineal hygiene, Adjust clothing prior to toileting, Adjust clothing after toileting Toileting steps completed by helper: Adjust clothing prior to toileting, Performs perineal hygiene, Adjust clothing after toileting Toileting Assistive Devices: Grab bar or rail Assist level: Touching or steadying assistance (Pt.75%)  Function - Air cabin crew transfer assistive device: Grab bar Assist level to toilet: Moderate assist (Pt 50 - 74%/lift or lower) Assist level from toilet: Moderate assist (Pt 50 - 74%/lift or lower) Assist level to bedside commode (at bedside): Moderate assist (Pt 50 - 74%/lift or lower)(per Elby Beck, NT and Elmo Putt, NT) Assist level from bedside commode (  at bedside): Moderate assist (Pt 50 - 74%/lift or lower)  Function - Chair/bed transfer Chair/bed transfer method: Stand pivot Chair/bed transfer assist level: Touching or steadying assistance (Pt > 75%) Chair/bed transfer assistive device: Armrests Chair/bed transfer details: Verbal cues for technique, Verbal cues for precautions/safety, Manual facilitation for weight shifting  Function - Locomotion: Wheelchair Will patient use wheelchair at discharge?: No Type: Manual Max wheelchair distance: 132f Assist Level: Supervision or verbal cues Assist Level: Supervision or verbal cues Function - Locomotion: Ambulation Assistive device: Walker-rolling Max distance: 40 ft Assist level: Touching or steadying assistance (Pt > 75%) Assist level: Touching or steadying assistance (Pt > 75%) Walk 50 feet with 2 turns activity did not occur: Safety/medical concerns Assist level: Touching or steadying assistance (Pt > 75%) Walk 150 feet activity did not occur: Safety/medical concerns Walk 10 feet on uneven surfaces activity did not occur: Safety/medical concerns  Function - Comprehension Comprehension: Auditory Comprehension assist level: Follows basic  conversation/direction with no assist  Function - Expression Expression: Verbal Expression assist level: Expresses basic needs/ideas: With no assist  Function - Social Interaction Social Interaction assist level: Interacts appropriately with others with medication or extra time (anti-anxiety, antidepressant).  Function - Problem Solving Problem solving assist level: Solves basic 90% of the time/requires cueing < 10% of the time  Function - Memory Memory assist level: Recognizes or recalls 90% of the time/requires cueing < 10% of the time Patient normally able to recall (first 3 days only): Current season, Location of own room, Staff names and faces, That he or she is in a hospital  Medical Problem List and Plan: 1.Functional deficits due to RUE>RLE weaknesssecondary to small subcortical infarct CIR PT, OT, Team conference today please see physician documentation under team conference tab, met with team face-to-face to discuss problems,progress, and goals. Formulized individual treatment plan based on medical history, underlying problem and comorbidities. 2. DVT Prophylaxis/Anticoagulation: Pharmaceutical:Lovenox 3. Pain Management:Tylenol prn for HA. Tramadol prn for moderate to severe pain. Kpad and sportscreme for neck 4. Mood:LCSW to follow for evaluation and and support 5. Neuropsych: This patientiscapable of making decisions onherown behalf. 6. Skin/Wound Care:Routine pressure relief measures 7. Fluids/Electrolytes/Nutrition:Monitor I's and O's. Check lytesin a.m. 8.HTN: Monitor: Blood pressures twice daily--has been labile. Avoid hypotension to allow for adequate perfusion. Will continue Norvasc daily Vitals:   02/13/18 2207 02/14/18 0630  BP: 118/63 123/77  Pulse: 73 77  Resp: 17 16  Temp: (!) 97.4 F (36.3 C) 98 F (36.7 C)  SpO2: 98% 99%  Controlled 02/14/2018           9.T2DM: We will monitor blood sugars before meals at bedtime. Resume home dose  Glucotrol. CBG (last 3)  Controlled 02/14/2018 Recent Labs    02/13/18 1635 02/13/18 2208 02/14/18 0706  GLUCAP 122* 122* 105*  10.Chronic low back pain: Resume Cymbalta. Question tramadol as needed for pain 11.Iron deficiency anemia: Recheck CBC 12.CKD stage IV: Has improved with hydration ---DC IV fluids as intake has been good. BUN/SCr 52/1.7 at admission.GFR currently ~20 Creat up to 2.1, BUN 58 , will give IVF at noc, I [id number] LOS (Days) 8 A FACE TO FACE EVALUATION WAS PERFORMED  AnCharlett Blake/04/2018, 9:04 AM

## 2018-02-14 NOTE — Progress Notes (Signed)
Orthopedic Tech Progress Note Patient Details:  Brandy Barnett [birth year] [id number]  Patient ID: [name], female   DOB: 04/04/1933, 84 y.o.   MRN: [id number]   Nikki Dom Called in hanger brace order; spoke with [facility]

## 2018-02-14 NOTE — Patient Care Conference (Signed)
Inpatient RehabilitationTeam Conference and Plan of Care Update Date: 02/14/2018   Time: 11:15 AM    Patient Name: Brandy Barnett      Medical Record Number: [id number]  Date of Birth: 30-Dec-1932 Sex: Female         Room/Bed: 4W21C/4W21C-01 Payor Info: Payor: MEDICARE / Plan: MEDICARE PART A AND B / Product Type: *No Product type* /    Admitting Diagnosis: small subcortical intarct wih residual hemiplegia  Admit Date/Time:  02/06/2018  2:13 PM Admission Comments: No comment available   Primary Diagnosis:  <principal problem not specified> Principal Problem: <principal problem not specified>  Patient Active Problem List   Diagnosis Date Noted  . Hyperlipidemia 02/06/2018  . 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 Right hemiparesis (HCC)   . Benign essential HTN   . Chronic bilateral low back pain without sciatica   . Iron deficiency anemia   . Chronic kidney disease (CKD), stage IV (severe) (HCC)   . Acute ischemic stroke [facility]) s/p IV tPA 02/02/2018    Expected Discharge Date: Expected Discharge Date: 02/22/18  Team Members Present: Physician leading conference: Dr. Claudette Laws Social Worker Present: Dossie Der, LCSW Nurse Present: Ronny Bacon, RN PT Present: Woodfin Ganja, PT OT Present: Roney Mans, OT SLP Present: Feliberto Gottron, SLP PPS Coordinator present : Edson Snowball, PT     Current Status/Progress Goal Weekly Team Focus  Medical   Mod I UE dressing, poor usage of RUE, needs reinforcement  reduced fall risk, reduce knee hyperext  COnt rehab   Bowel/Bladder   continent of B & B, LBM 9/9  pt will remain continent with normal bowel pattern  continue to monitor   Swallow/Nutrition/ Hydration             ADL's   min A standing balance, min A toileting and LB dressing, min A bathing and UB dressing, improving use of RUE as a stabilizing A  Mod I with standing with AD,  mod I toileting, S bathing and dressing  ADL training, RUE NMR, balance, functional mobility, pt/family education   Mobility   min assist overall, gait 40' w/ RW  supervision   progressing gait, functional balance, all functional mobility, endurance, RLE NMR   Communication             Safety/Cognition/ Behavioral Observations            Pain   no c/o pain, has tylenol & tramadol prn  pt will be pain free  assess & treat as needed   Skin   various areas of bruising, no skin break down  pt remains free of skin breakdown and infection  assess q shift      *See Care Plan and progress notes for long and short-term goals.     Barriers to Discharge  Current Status/Progress Possible Resolutions Date Resolved   Physician    Decreased caregiver support;Medical stability;Other (comments)  orthotic eval pending  Cont rehab, orthotic consult  Cont rehab      Nursing                  PT  Decreased caregiver support  Pt does not want to burden her children with her care              OT                  SLP  SW                Discharge Planning/Teaching Needs:  HOme with son who is retired but in and out. Making good progress in therapies, will ask son to come in and attend therapies with pt.      Team Discussion:  Progressing toward her goals of supervision-mod/i level. Balance issues and reinforcement to use affected side. Mod assist -steps. Order for AFO. IV fluids tonight need to encourage fluids. See is son can provide this level of care. Have come in next week.  Revisions to Treatment Plan:  DC 9/19    Continued Need for Acute Rehabilitation Level of Care: The patient requires daily medical management by a physician with specialized training in physical medicine and rehabilitation for the following conditions: Daily direction of a multidisciplinary physical rehabilitation program to ensure safe treatment while eliciting the highest outcome that is of practical value  to the patient.: Yes Daily medical management of patient stability for increased activity during participation in an intensive rehabilitation regime.: Yes Daily analysis of laboratory values and/or radiology reports with any subsequent need for medication adjustment of medical intervention for : Post surgical problems;Neurological problems   I attest that I was present, lead the team conference, and concur with the assessment and plan of the team.   Lucy Chris 02/14/2018, 3:49 PM

## 2018-02-15 ENCOUNTER — Inpatient Hospital Stay (HOSPITAL_COMMUNITY): Payer: BLUE CROSS/BLUE SHIELD | Admitting: Physical Therapy

## 2018-02-15 ENCOUNTER — Inpatient Hospital Stay (HOSPITAL_COMMUNITY): Payer: BLUE CROSS/BLUE SHIELD

## 2018-02-15 ENCOUNTER — Inpatient Hospital Stay (HOSPITAL_COMMUNITY): Payer: Medicare Other | Admitting: Physical Therapy

## 2018-02-15 LAB — GLUCOSE, CAPILLARY
GLUCOSE-CAPILLARY: 97 mg/dL (ref 70–99)
GLUCOSE-CAPILLARY: 106 mg/dL — AB (ref 70–99)
GLUCOSE-CAPILLARY: 126 mg/dL — AB (ref 70–99)
Glucose-Capillary: 79 mg/dL (ref 70–99)

## 2018-02-15 NOTE — Progress Notes (Signed)
Physical Therapy Session Note  Patient Details  Name: Brandy Barnett MRN: [id number] Date of Birth: 04-07-1933  Today's Date: 02/15/2018 PT Individual Time: 1000-1053 PT Individual Time Calculation (min): 53 min   Short Term Goals: Week 2:  PT Short Term Goal 1 (Week 2): STG=LTG due to ELOS  Skilled Therapeutic Interventions/Progress Updates:   Pt in w/c and agreeable to therapy, denies pain but requesting to toilet prior to leaving room. Performed stand pivot transfer to/from toilet w/ min assist for balance and min assist for LE garment management. Supervision for pericare. Total assist w/c transport to/from gym. Session focused on RLE NMR and gait/pregait tasks. Ambulated 40' w/ min guard w/ RW and RAFO. Pt hyperextending RLE in stance 50% of the time w/ verbal cues for increased knee control and to increase BOS. Performed pregait tasks including R and L stepping to 2" step, tactile and manual cues for R knee control in single leg stance and verbal cues for increased R hip flexion when stepping w/ RLE. Performed 2x10 each side w/ eyes open and 2x10 each side w/ eyes closed to work on proprioception. Performed [employer] limits of stability training w/ UE support at lowest setting. 21% first bout, improving to 44% on 2nd bout. Manual, verbal, and tactile cues for balance strategies. Returned to room and assisted w/ transfer back to EOB, min assist. Ended session in supine, call bell within reach and all needs met.   Therapy Documentation Precautions:  Precautions Precautions: Fall Precaution Comments: pt reports several falls Restrictions Weight Bearing Restrictions: No Pain: Pain Assessment Pain Scale: 0-10 Pain Score: 0-No pain  See Function Navigator for Current Functional Status.   Therapy/Group: Individual Therapy  Agapito Hanway K Arnette 02/15/2018, 10:54 AM

## 2018-02-15 NOTE — Progress Notes (Signed)
Physical Therapy Session Note  Patient Details  Name: Lorenda CahillLu S Summerall MRN: [id number] Date of Birth: Sep 20, 1932  Today's Date: 02/15/2018 PT Individual Time: 1415-1530 PT Individual Time Calculation (min): 75 min   Short Term Goals: Week 2:  PT Short Term Goal 1 (Week 2): STG=LTG due to ELOS  Skilled Therapeutic Interventions/Progress Updates:    Pt received seated in w/c in room, agreeable to PT. No complaints of pain. Sit to stand with min A to RW, v/c for safe setup prior to each transfer and for UE placement. Pt exhibits poor carryover and needs cues before every transfer throughout therapy session. Ambulation 2 x 60 ft with RW and min A with R AFO and heel wedge, v/c for R heel strike. Pt exhibits ongoing R knee hyperextension with gait. AFO consult with orthotist, trial rigid R AFO with and without heel wedge. Pt exhibits decreased R knee hyperext with rigid AFO and shows no difference in gait with and without heel wedge so wedge removed. Toilet transfer with min A for balance and with use of RW. Pt is able to complete clothing management prior to sitting as well as pericare but requires assist to pull up pants following toileting. Blocked practice of pulling pants up/down simulation with use of theraband around waist, focus on one UE support on RW while pt manages clothing. Pt is able to manage clothing with LUE but struggles with use of of RUE. Pt also exhibits poor awareness of RUE with functional tasks and requires tactile and verbal cues to attend to R hand and to make sure it is secure on RW. Standing R knee TKE 2 x 10 reps with green theraband and manual cues to prevent hyperext. Standing mini-squats x 10 reps with focus on B quad control. Assisted pt back to bed at end of session, min A for RLE management. Pt left semi-reclined in bed with needs in reach and bed alarm in place.  Therapy Documentation Precautions:  Precautions Precautions: Fall Precaution Comments: pt reports several  falls Restrictions Weight Bearing Restrictions: No  See Function Navigator for Current Functional Status.   Therapy/Group: Individual Therapy  Peter Congoaylor Priyansh Pry, PT, DPT  02/15/2018, 3:42 PM

## 2018-02-15 NOTE — Progress Notes (Signed)
Occupational Therapy Session Note  Patient Details  Name: Brandy Barnett MRN: [id number] Date of Birth: 28-Jul-1932  Today's Date: 02/15/2018 OT Individual Time: 0700-0755 OT Individual Time Calculation (min): 55 min    Short Term Goals: Week 2:  OT Short Term Goal 1 (Week 2): STGs = LTGs  Skilled Therapeutic Interventions/Progress Updates:    Pt resting in bed upon arrival and agreeable to therapy.  OT intervention with focus on bed mobility, sitting balance, functional transfers, sit<>stand, self feeding, activity tolerance, and safety awareness to increase independence with BADLs. Pt declined bathing this morning, stating that she preferred a female therapist assist with bathing/dressing tasks.  Schedule modifications made for future ADLs. Pt required assistance with opening containers but was able to cut sausage without assistance. Pt requires steady A for sit<>stand and standing balance.  Pt required min A for stand pivot transfer without AD. Pt remained seated in w/c with all needs within reach.   Therapy Documentation Precautions:  Precautions Precautions: Fall Precaution Comments: pt reports several falls Restrictions Weight Bearing Restrictions: No   Pain:  Pt denies pain  See Function Navigator for Current Functional Status.   Therapy/Group: Individual Therapy  Rich BraveLanier, Brandy Barnett 02/15/2018, 8:00 AM

## 2018-02-15 NOTE — Progress Notes (Signed)
Subjective/Complaints:  Discussed d/c to home vs SNF.  Pt's son is retired and able to provide 24/7 sup  Review of systems denies any chest pain shortness of breath nausea vomiting diarrhea or constipation.  Objective: Vital Signs: Blood pressure 104/60, pulse 71, temperature 97.9 F (36.6 C), temperature source Oral, resp. rate 16, height '5\' 2"'$  (1.575 m), weight 64 kg, SpO2 99 %. No results found. Results for orders placed or performed during the hospital encounter of 02/06/18 (from the past 72 hour(s))  Glucose, capillary     Status: Abnormal   Collection Time: 02/12/18 11:51 AM  Result Value Ref Range   Glucose-Capillary 150 (H) 70 - 99 mg/dL  Glucose, capillary     Status: Abnormal   Collection Time: 02/12/18  5:04 PM  Result Value Ref Range   Glucose-Capillary 64 (L) 70 - 99 mg/dL  Glucose, capillary     Status: Abnormal   Collection Time: 02/12/18  5:38 PM  Result Value Ref Range   Glucose-Capillary 133 (H) 70 - 99 mg/dL  Glucose, capillary     Status: Abnormal   Collection Time: 02/12/18  9:28 PM  Result Value Ref Range   Glucose-Capillary 104 (H) 70 - 99 mg/dL  Glucose, capillary     Status: None   Collection Time: 02/13/18  7:11 AM  Result Value Ref Range   Glucose-Capillary 94 70 - 99 mg/dL  Glucose, capillary     Status: Abnormal   Collection Time: 02/13/18 11:52 AM  Result Value Ref Range   Glucose-Capillary 125 (H) 70 - 99 mg/dL  Glucose, capillary     Status: Abnormal   Collection Time: 02/13/18  4:35 PM  Result Value Ref Range   Glucose-Capillary 122 (H) 70 - 99 mg/dL  Glucose, capillary     Status: Abnormal   Collection Time: 02/13/18 10:08 PM  Result Value Ref Range   Glucose-Capillary 122 (H) 70 - 99 mg/dL  Basic metabolic panel     Status: Abnormal   Collection Time: 02/14/18  5:00 AM  Result Value Ref Range   Sodium 143 135 - 145 mmol/L   Potassium 4.3 3.5 - 5.1 mmol/L   Chloride 111 98 - 111 mmol/L   CO2 22 22 - 32 mmol/L   Glucose, Bld 99 70  - 99 mg/dL   BUN 58 (H) 8 - 23 mg/dL   Creatinine, Ser 2.10 (H) 0.44 - 1.00 mg/dL   Calcium 8.9 8.9 - 10.3 mg/dL   GFR calc non Af Amer 21 (L) >60 mL/min   GFR calc Af Amer 24 (L) >60 mL/min    Comment: (NOTE) The eGFR has been calculated using the CKD EPI equation. This calculation has not been validated in all clinical situations. eGFR's persistently <60 mL/min signify possible Chronic Kidney Disease.    Anion gap 10 5 - 15    Comment: Performed at [facility] [address], [place], [place] [address]  CBC     Status: Abnormal   Collection Time: 02/14/18  5:00 AM  Result Value Ref Range   WBC 9.7 4.0 - 10.5 K/uL   RBC 3.48 (L) 3.87 - 5.11 MIL/uL   Hemoglobin 10.3 (L) 12.0 - 15.0 g/dL   HCT 31.9 (L) 36.0 - 46.0 %   MCV 91.7 78.0 - 100.0 fL   MCH 29.6 26.0 - 34.0 pg   MCHC 32.3 30.0 - 36.0 g/dL   RDW 12.4 11.5 - 15.5 %   Platelets 213 150 - 400 K/uL  Comment: Performed at [facility], [place] [address] [place], [place] [address]  Glucose, capillary     Status: Abnormal   Collection Time: 02/14/18  7:06 AM  Result Value Ref Range   Glucose-Capillary 105 (H) 70 - 99 mg/dL  Glucose, capillary     Status: Abnormal   Collection Time: 02/14/18 11:20 AM  Result Value Ref Range   Glucose-Capillary 146 (H) 70 - 99 mg/dL  Glucose, capillary     Status: Abnormal   Collection Time: 02/14/18  4:31 PM  Result Value Ref Range   Glucose-Capillary 106 (H) 70 - 99 mg/dL  Glucose, capillary     Status: None   Collection Time: 02/14/18  9:24 PM  Result Value Ref Range   Glucose-Capillary 98 70 - 99 mg/dL  Glucose, capillary     Status: None   Collection Time: 02/15/18  7:01 AM  Result Value Ref Range   Glucose-Capillary 97 70 - 99 mg/dL     HEENT: normal Cardio: RRR and No murmur Resp: CTA B/L and Unlabored GI: BS positive and Distention Extremity:  No Edema Skin:   Intact Neuro: Alert/Oriented, Normal Sensory, Abnormal Motor 3+ in the right deltoid, bicep, tricep,  grip, 4/5 in the right hip flexor knee extensor ankle dorsiflexor and Normal FMC Musc/Skel:  Normal, no pain to palpation in cervical paraspinals no pain with ROM General no acute distress, mood and affect are appropriate   Assessment/Plan: 1. Functional deficits secondary to small left subcortical infarct not noted on MRI which require 3+ hours per day of interdisciplinary therapy in a comprehensive inpatient rehab setting. Physiatrist is providing close team supervision and 24 hour management of active medical problems listed below. Physiatrist and rehab team continue to assess barriers to discharge/monitor patient progress toward functional and medical goals. FIM: Function - Bathing Position: Shower Body parts bathed by patient: Right arm, Chest, Abdomen, Right upper leg, Left upper leg, Left lower leg, Right lower leg, Front perineal area, Buttocks Body parts bathed by helper: Left arm, Back Assist Level: (partial/moderate assist)  Function- Upper Body Dressing/Undressing What is the patient wearing?: Bra, Pull over shirt/dress Bra - Perfomed by patient: Thread/unthread right bra strap, Thread/unthread left bra strap Bra - Perfomed by helper: Hook/unhook bra (pull down sports bra) Pull over shirt/dress - Perfomed by patient: Thread/unthread left sleeve, Thread/unthread right sleeve, Put head through opening, Pull shirt over trunk Pull over shirt/dress - Perfomed by helper: Thread/unthread right sleeve, Thread/unthread left sleeve(long sleeved shirt) Assist Level: (max assist) Function - Lower Body Dressing/Undressing What is the patient wearing?: Pants, Non-skid slipper socks, AFO, Shoes Position: Wheelchair/chair at sink Underwear - Performed by helper: Thread/unthread right underwear leg, Thread/unthread left underwear leg, Pull underwear up/down Pants- Performed by patient: Thread/unthread right pants leg, Thread/unthread left pants leg Pants- Performed by helper: Pull pants  up/down Non-skid slipper socks- Performed by helper: Don/doff right sock, Don/doff left sock Shoes - Performed by patient: Don/doff left shoe, Fasten left, Fasten right Shoes - Performed by helper: Don/doff right shoe AFO - Performed by helper: Don/doff right AFO Assist for footwear: Partial/moderate assist Assist for lower body dressing: Touching or steadying assistance (Pt > 75%)  Function - Toileting Toileting steps completed by patient: Performs perineal hygiene, Adjust clothing prior to toileting, Adjust clothing after toileting Toileting steps completed by helper: Adjust clothing prior to toileting, Performs perineal hygiene, Adjust clothing after toileting Toileting Assistive Devices: Grab bar or rail, Other (comment)(walker) Assist level: Touching or steadying assistance (Pt.75%)  Function -  Air cabin crew transfer assistive device: Grab bar, Walker, Elevated toilet seat/BSC over toilet Assist level to toilet: Touching or steadying assistance (Pt > 75%) Assist level from toilet: Touching or steadying assistance (Pt > 75%) Assist level to bedside commode (at bedside): Moderate assist (Pt 50 - 74%/lift or lower)(per Elby Beck, NT and Elmo Putt, NT) Assist level from bedside commode (at bedside): Moderate assist (Pt 50 - 74%/lift or lower)  Function - Chair/bed transfer Chair/bed transfer method: Stand pivot Chair/bed transfer assist level: Touching or steadying assistance (Pt > 75%) Chair/bed transfer assistive device: Armrests Chair/bed transfer details: Verbal cues for technique, Verbal cues for precautions/safety, Manual facilitation for weight shifting  Function - Locomotion: Wheelchair Will patient use wheelchair at discharge?: No Type: Manual Max wheelchair distance: 148f Assist Level: Supervision or verbal cues Assist Level: Supervision or verbal cues Function - Locomotion: Ambulation Assistive device: Walker-rolling Max distance: 60 ft Assist level:  Touching or steadying assistance (Pt > 75%) Assist level: Touching or steadying assistance (Pt > 75%) Walk 50 feet with 2 turns activity did not occur: Safety/medical concerns Assist level: Touching or steadying assistance (Pt > 75%) Walk 150 feet activity did not occur: Safety/medical concerns Walk 10 feet on uneven surfaces activity did not occur: Safety/medical concerns  Function - Comprehension Comprehension: Auditory Comprehension assist level: Follows basic conversation/direction with no assist  Function - Expression Expression: Verbal Expression assist level: Expresses basic needs/ideas: With no assist  Function - Social Interaction Social Interaction assist level: Interacts appropriately with others with medication or extra time (anti-anxiety, antidepressant).  Function - Problem Solving Problem solving assist level: Solves basic 90% of the time/requires cueing < 10% of the time  Function - Memory Memory assist level: Recognizes or recalls 90% of the time/requires cueing < 10% of the time Patient normally able to recall (first 3 days only): Current season, Location of own room, Staff names and faces, That he or she is in a hospital  Medical Problem List and Plan: 1.Functional deficits due to RUE>RLE weaknesssecondary to small subcortical infarct CIR PT, OT, plan is home  With 24/7 sup     2. DVT Prophylaxis/Anticoagulation: Pharmaceutical:Lovenox 3. Pain Management:Tylenol prn for HA. Tramadol prn for moderate to severe pain. Kpad and sportscreme for neck 4. Mood:LCSW to follow for evaluation and and support 5. Neuropsych: This patientiscapable of making decisions onherown behalf. 6. Skin/Wound Care:Routine pressure relief measures 7. Fluids/Electrolytes/Nutrition:Monitor I's and O's. Check lytesin a.m. 8.HTN: Monitor: Blood pressures twice daily--has been labile. Avoid hypotension to allow for adequate perfusion. Will continue Norvasc daily Vitals:    02/14/18 1547 02/14/18 2121  BP: (!) 141/70 104/60  Pulse: 79 71  Resp: 12 16  Temp: 97.9 F (36.6 C) 97.9 F (36.6 C)  SpO2: 95% 99%  Controlled 02/15/2018           9.T2DM: We will monitor blood sugars before meals at bedtime. Resume home dose Glucotrol. CBG (last 3)  Controlled 02/15/2018 Recent Labs    02/14/18 1631 02/14/18 2124 02/15/18 0701  GLUCAP 106* 98 97  10.Chronic low back pain: Resume Cymbalta. Question tramadol as needed for pain 11.Iron deficiency anemia: Recheck CBC 12.CKD stage IV: Has improved with hydration ---DC IV fluids as intake has been good. BUN/SCr 52/1.7 at admission.GFR currently ~20 Creat up to 2.1, BUN 58 , will give IVF at noc, I [id number] LOS (Days) 9 A FACE TO FACE EVALUATION WAS PERFORMED  AnCharlett Blake/05/2018, 8:23 AM

## 2018-02-16 ENCOUNTER — Inpatient Hospital Stay (HOSPITAL_COMMUNITY): Payer: BLUE CROSS/BLUE SHIELD | Admitting: Occupational Therapy

## 2018-02-16 ENCOUNTER — Inpatient Hospital Stay (HOSPITAL_COMMUNITY): Payer: BLUE CROSS/BLUE SHIELD | Admitting: Physical Therapy

## 2018-02-16 ENCOUNTER — Inpatient Hospital Stay (HOSPITAL_COMMUNITY): Payer: BLUE CROSS/BLUE SHIELD

## 2018-02-16 LAB — BASIC METABOLIC PANEL
Anion gap: 9 (ref 5–15)
BUN: 51 mg/dL — ABNORMAL HIGH (ref 8–23)
CHLORIDE: 110 mmol/L (ref 98–111)
CO2: 23 mmol/L (ref 22–32)
Calcium: 9 mg/dL (ref 8.9–10.3)
Creatinine, Ser: 2.05 mg/dL — ABNORMAL HIGH (ref 0.44–1.00)
GFR calc Af Amer: 25 mL/min — ABNORMAL LOW (ref >60)
GFR calc non Af Amer: 21 mL/min — ABNORMAL LOW (ref >60)
GLUCOSE: 103 mg/dL — AB (ref 70–99)
POTASSIUM: 4.2 mmol/L (ref 3.5–5.1)
Sodium: 142 mmol/L (ref 135–145)

## 2018-02-16 LAB — GLUCOSE, CAPILLARY
Glucose-Capillary: 105 mg/dL — ABNORMAL HIGH (ref 70–99)
Glucose-Capillary: 103 mg/dL — ABNORMAL HIGH (ref 70–99)
Glucose-Capillary: 153 mg/dL — ABNORMAL HIGH (ref 70–99)
Glucose-Capillary: 75 mg/dL (ref 70–99)

## 2018-02-16 MED ORDER — SODIUM CHLORIDE 0.45 % IV SOLN
INTRAVENOUS | Status: DC
  Administered 2018-02-16 – 2018-02-20 (×6): via INTRAVENOUS

## 2018-02-16 NOTE — Progress Notes (Signed)
Social Work Patient ID: Brandy Barnett, female   DOB: 1932/09/14, 84 y.o.   MRN: [id number]  Met with son and pt to discuss discharge plans. Son wants to make sur ept is able to move around and go to the bathroom on her own before she comes home. He has gone to [facility] to inquire about their services. RN is coming to assess pt today. Discussed this is not covered by insurance. He also plans to go to [employer] and [employer]. Have scheduled for him to go through family training for Tuesday @ 10:00. Work on best plan for pt at  discharge.

## 2018-02-16 NOTE — Progress Notes (Signed)
Subjective/Complaints:  No issues overnite, discussed renal fxn, sees nephro in [place] on outpt basis  Review of systems denies any chest pain shortness of breath nausea vomiting diarrhea or constipation.  Objective: Vital Signs: Blood pressure 140/73, pulse 98, temperature 98.6 F (37 C), temperature source Oral, resp. rate 16, height '5\' 2"'$  (1.575 m), weight 64 kg, SpO2 99 %. No results found. Results for orders placed or performed during the hospital encounter of 02/06/18 (from the past 72 hour(s))  Glucose, capillary     Status: Abnormal   Collection Time: 02/13/18 11:52 AM  Result Value Ref Range   Glucose-Capillary 125 (H) 70 - 99 mg/dL  Glucose, capillary     Status: Abnormal   Collection Time: 02/13/18  4:35 PM  Result Value Ref Range   Glucose-Capillary 122 (H) 70 - 99 mg/dL  Glucose, capillary     Status: Abnormal   Collection Time: 02/13/18 10:08 PM  Result Value Ref Range   Glucose-Capillary 122 (H) 70 - 99 mg/dL  Basic metabolic panel     Status: Abnormal   Collection Time: 02/14/18  5:00 AM  Result Value Ref Range   Sodium 143 135 - 145 mmol/L   Potassium 4.3 3.5 - 5.1 mmol/L   Chloride 111 98 - 111 mmol/L   CO2 22 22 - 32 mmol/L   Glucose, Bld 99 70 - 99 mg/dL   BUN 58 (H) 8 - 23 mg/dL   Creatinine, Ser 2.10 (H) 0.44 - 1.00 mg/dL   Calcium 8.9 8.9 - 10.3 mg/dL   GFR calc non Af Amer 21 (L) >60 mL/min   GFR calc Af Amer 24 (L) >60 mL/min    Comment: (NOTE) The eGFR has been calculated using the CKD EPI equation. This calculation has not been validated in all clinical situations. eGFR's persistently <60 mL/min signify possible Chronic Kidney Disease.    Anion gap 10 5 - 15    Comment: Performed at [facility] [address], [place], [place] [address]  CBC     Status: Abnormal   Collection Time: 02/14/18  5:00 AM  Result Value Ref Range   WBC 9.7 4.0 - 10.5 K/uL   RBC 3.48 (L) 3.87 - 5.11 MIL/uL   Hemoglobin 10.3 (L) 12.0 - 15.0 g/dL   HCT 31.9 (L)  36.0 - 46.0 %   MCV 91.7 78.0 - 100.0 fL   MCH 29.6 26.0 - 34.0 pg   MCHC 32.3 30.0 - 36.0 g/dL   RDW 12.4 11.5 - 15.5 %   Platelets 213 150 - 400 K/uL    Comment: Performed at [facility], [place] [address] [place], [place] [address]  Glucose, capillary     Status: Abnormal   Collection Time: 02/14/18  7:06 AM  Result Value Ref Range   Glucose-Capillary 105 (H) 70 - 99 mg/dL  Glucose, capillary     Status: Abnormal   Collection Time: 02/14/18 11:20 AM  Result Value Ref Range   Glucose-Capillary 146 (H) 70 - 99 mg/dL  Glucose, capillary     Status: Abnormal   Collection Time: 02/14/18  4:31 PM  Result Value Ref Range   Glucose-Capillary 106 (H) 70 - 99 mg/dL  Glucose, capillary     Status: None   Collection Time: 02/14/18  9:24 PM  Result Value Ref Range   Glucose-Capillary 98 70 - 99 mg/dL  Glucose, capillary     Status: None   Collection Time: 02/15/18  7:01 AM  Result  Value Ref Range   Glucose-Capillary 97 70 - 99 mg/dL  Glucose, capillary     Status: Abnormal   Collection Time: 02/15/18 11:35 AM  Result Value Ref Range   Glucose-Capillary 106 (H) 70 - 99 mg/dL   Comment 1 Notify RN   Glucose, capillary     Status: Abnormal   Collection Time: 02/15/18  4:54 PM  Result Value Ref Range   Glucose-Capillary 126 (H) 70 - 99 mg/dL   Comment 1 Notify RN   Glucose, capillary     Status: None   Collection Time: 02/15/18  9:44 PM  Result Value Ref Range   Glucose-Capillary 79 70 - 99 mg/dL  Glucose, capillary     Status: Abnormal   Collection Time: 02/16/18  6:50 AM  Result Value Ref Range   Glucose-Capillary 105 (H) 70 - 99 mg/dL  Basic metabolic panel     Status: Abnormal   Collection Time: 02/16/18  7:02 AM  Result Value Ref Range   Sodium 142 135 - 145 mmol/L   Potassium 4.2 3.5 - 5.1 mmol/L   Chloride 110 98 - 111 mmol/L   CO2 23 22 - 32 mmol/L   Glucose, Bld 103 (H) 70 - 99 mg/dL   BUN 51 (H) 8 - 23 mg/dL   Creatinine, Ser 2.05 (H) 0.44 - 1.00 mg/dL    Calcium 9.0 8.9 - 10.3 mg/dL   GFR calc non Af Amer 21 (L) >60 mL/min   GFR calc Af Amer 25 (L) >60 mL/min    Comment: (NOTE) The eGFR has been calculated using the CKD EPI equation. This calculation has not been validated in all clinical situations. eGFR's persistently <60 mL/min signify possible Chronic Kidney Disease.    Anion gap 9 5 - 15    Comment: Performed at [facility] [address] [place], [place] [address]     HEENT: normal Cardio: RRR and No murmur Resp: CTA B/L and Unlabored GI: BS positive and Distention Extremity:  No Edema Skin:   Intact Neuro: Alert/Oriented, Normal Sensory, Abnormal Motor 3+ in the right deltoid, bicep, tricep, grip, 4/5 in the right hip flexor knee extensor ankle dorsiflexor and Normal FMC Musc/Skel:  Normal, no pain to palpation in cervical paraspinals no pain with ROM General no acute distress, mood and affect are appropriate   Assessment/Plan: 1. Functional deficits secondary to small left subcortical infarct not noted on MRI which require 3+ hours per day of interdisciplinary therapy in a comprehensive inpatient rehab setting. Physiatrist is providing close team supervision and 24 hour management of active medical problems listed below. Physiatrist and rehab team continue to assess barriers to discharge/monitor patient progress toward functional and medical goals. FIM: Function - Bathing Position: Shower Body parts bathed by patient: Right arm, Chest, Abdomen, Right upper leg, Left upper leg, Left lower leg, Right lower leg, Front perineal area, Buttocks Body parts bathed by helper: Left arm, Back Assist Level: (partial/moderate assist)  Function- Upper Body Dressing/Undressing What is the patient wearing?: Bra, Pull over shirt/dress Bra - Perfomed by patient: Thread/unthread right bra strap, Thread/unthread left bra strap Bra - Perfomed by helper: Hook/unhook bra (pull down sports bra) Pull over shirt/dress - Perfomed by  patient: Thread/unthread left sleeve, Thread/unthread right sleeve, Put head through opening, Pull shirt over trunk Pull over shirt/dress - Perfomed by helper: Thread/unthread right sleeve, Thread/unthread left sleeve(long sleeved shirt) Assist Level: (max assist) Function - Lower Body Dressing/Undressing What is the patient wearing?: Pants, Non-skid slipper socks,  AFO, Shoes Position: Wheelchair/chair at sink Underwear - Performed by helper: Thread/unthread right underwear leg, Thread/unthread left underwear leg, Pull underwear up/down Pants- Performed by patient: Thread/unthread right pants leg, Thread/unthread left pants leg Pants- Performed by helper: Pull pants up/down Non-skid slipper socks- Performed by helper: Don/doff right sock, Don/doff left sock Shoes - Performed by patient: Don/doff left shoe, Fasten left, Fasten right Shoes - Performed by helper: Don/doff right shoe AFO - Performed by helper: Don/doff right AFO Assist for footwear: Partial/moderate assist Assist for lower body dressing: Touching or steadying assistance (Pt > 75%)  Function - Toileting Toileting steps completed by patient: Adjust clothing prior to toileting Toileting steps completed by helper: Performs perineal hygiene, Adjust clothing after toileting Toileting Assistive Devices: Grab bar or rail, Other (comment) Assist level: Touching or steadying assistance (Pt.75%)  Function - Air cabin crew transfer assistive device: Grab bar, Walker, Elevated toilet seat/BSC over toilet Assist level to toilet: Touching or steadying assistance (Pt > 75%) Assist level from toilet: Touching or steadying assistance (Pt > 75%) Assist level to bedside commode (at bedside): Moderate assist (Pt 50 - 74%/lift or lower)(per Elby Beck, NT and Elmo Putt, NT) Assist level from bedside commode (at bedside): Moderate assist (Pt 50 - 74%/lift or lower)  Function - Chair/bed transfer Chair/bed transfer method: Stand  pivot Chair/bed transfer assist level: Touching or steadying assistance (Pt > 75%) Chair/bed transfer assistive device: Armrests Chair/bed transfer details: Verbal cues for technique, Verbal cues for precautions/safety, Manual facilitation for placement  Function - Locomotion: Wheelchair Will patient use wheelchair at discharge?: No Type: Manual Max wheelchair distance: 153f Assist Level: Supervision or verbal cues Assist Level: Supervision or verbal cues Function - Locomotion: Ambulation Assistive device: Walker-rolling Max distance: 654 Assist level: Touching or steadying assistance (Pt > 75%) Assist level: Touching or steadying assistance (Pt > 75%) Walk 50 feet with 2 turns activity did not occur: Safety/medical concerns Assist level: Touching or steadying assistance (Pt > 75%) Walk 150 feet activity did not occur: Safety/medical concerns Walk 10 feet on uneven surfaces activity did not occur: Safety/medical concerns  Function - Comprehension Comprehension: Auditory Comprehension assist level: Follows basic conversation/direction with no assist  Function - Expression Expression: Verbal Expression assist level: Expresses basic needs/ideas: With no assist  Function - Social Interaction Social Interaction assist level: Interacts appropriately with others with medication or extra time (anti-anxiety, antidepressant).  Function - Problem Solving Problem solving assist level: Solves basic 90% of the time/requires cueing < 10% of the time  Function - Memory Memory assist level: Recognizes or recalls 90% of the time/requires cueing < 10% of the time Patient normally able to recall (first 3 days only): Current season, Location of own room, Staff names and faces, That he or she is in a hospital  Medical Problem List and Plan: 1.Functional deficits due to RUE>RLE weaknesssecondary to small subcortical infarct CIR PT, OT, plan is home  With 24/7 sup     2. DVT  Prophylaxis/Anticoagulation: Pharmaceutical:Lovenox 3. Pain Management:Tylenol prn for HA. Tramadol prn for moderate to severe pain. Kpad and sportscreme for neck 4. Mood:LCSW to follow for evaluation and and support 5. Neuropsych: This patientiscapable of making decisions onherown behalf. 6. Skin/Wound Care:Routine pressure relief measures 7. Fluids/Electrolytes/Nutrition:Monitor I's and O's. Check lytesin a.m. 8.HTN: Monitor: Blood pressures twice daily--has been labile. Avoid hypotension to allow for adequate perfusion. Will continue Norvasc daily Vitals:   02/15/18 1900 02/16/18 0100  BP: 135/72 140/73  Pulse: 88 98  Resp: 17 16  Temp: 98 F (36.7 C) 98.6 F (37 C)  SpO2: 98% 99%  Controlled 02/16/2018           9.T2DM: We will monitor blood sugars before meals at bedtime. Resume home dose Glucotrol. CBG (last 3)  Controlled 02/16/2018 Recent Labs    02/15/18 1654 02/15/18 2144 02/16/18 0650  GLUCAP 126* 79 105*  10.Chronic low back pain: Resume Cymbalta. Question tramadol as needed for pain 11.Iron deficiency anemia: Recheck CBC 12.CKD stage IV: Has improved with hydration ---DC IV fluids as intake has been good. BUN/SCr 52/1.7 at admission.GFR currently ~20 Creat up to 2.05, BUN 51 , will give IVF at noc, baseline GFR ~30 , has nephro f/u in [facility] in Nov may need to move up appt to Oct Reviewed meds no obvious nephrotoxic meds on list LOS (Days) 10 A FACE TO FACE EVALUATION WAS PERFORMED  Charlett Blake 02/16/2018, 8:34 AM

## 2018-02-16 NOTE — Progress Notes (Signed)
Occupational Therapy Session Note  Patient Details  Name: Brandy Barnett MRN: [id number] Date of Birth: 1933/04/30  Today's Date: 02/16/2018 OT Individual Time: 1405-1430 OT Individual Time Calculation (min): 25 min    Short Term Goals: Week 1:  OT Short Term Goal 1 (Week 1): Pt will be able to complete stand pivot to toilet with steadying A. OT Short Term Goal 1 - Progress (Week 1): Met OT Short Term Goal 2 (Week 1): Pt will be able to toilet with steadying A. OT Short Term Goal 2 - Progress (Week 1): Met OT Short Term Goal 3 (Week 1): Pt will be able to bathe with steadying A and use of AE as needed. OT Short Term Goal 3 - Progress (Week 1): Progressing toward goal OT Short Term Goal 4 (Week 1): Pt will be able to don pullover shirt with min A. OT Short Term Goal 4 - Progress (Week 1): Met OT Short Term Goal 5 (Week 1): Pt will be able to stand without UE support with min A to enable her to use her L hand to pull her pants up. OT Short Term Goal 5 - Progress (Week 1): Met  Skilled Therapeutic Interventions/Progress Updates:    1:1. Pt received asleep in bed. Pt argreeable to OT tx once aroused with no c/o pain. Pt completes stand pivot transfer with manual facilitation of RUE placement on armrest/bed rail and CGA. Pt completes FMC/NMR activites to manipulate blocks by flipping, reaching and grasp and release with touching A to facilitate shoulder flexion. Pt reaches for cup with VC for "open big" to activate finger extension to grasp around cup. Exited session with pt seated in w/c, call light in reach and all needs met  Therapy Documentation Precautions:  Precautions Precautions: Fall Precaution Comments: pt reports several falls Restrictions Weight Bearing Restrictions: No General:   Vital Signs:   Pain: Pain Assessment Pain Score: 0-No pain ADL: ADL ADL Comments: refer to functional navigator  See Function Navigator for Current Functional Status.   Therapy/Group:  Individual Therapy  Tonny Branch 02/16/2018, 2:29 PM

## 2018-02-16 NOTE — Progress Notes (Signed)
Occupational Therapy Session Note  Patient Details  Name: Brandy Barnett MRN: [id number] Date of Birth: 12-03-1932  Today's Date: 02/16/2018 OT Individual Time: 8527-7824 OT Individual Time Calculation (min): 60 min    Short Term Goals: Week 1:  OT Short Term Goal 1 (Week 1): Pt will be able to complete stand pivot to toilet with steadying A. OT Short Term Goal 1 - Progress (Week 1): Met OT Short Term Goal 2 (Week 1): Pt will be able to toilet with steadying A. OT Short Term Goal 2 - Progress (Week 1): Met OT Short Term Goal 3 (Week 1): Pt will be able to bathe with steadying A and use of AE as needed. OT Short Term Goal 3 - Progress (Week 1): Progressing toward goal OT Short Term Goal 4 (Week 1): Pt will be able to don pullover shirt with min A. OT Short Term Goal 4 - Progress (Week 1): Met OT Short Term Goal 5 (Week 1): Pt will be able to stand without UE support with min A to enable her to use her L hand to pull her pants up. OT Short Term Goal 5 - Progress (Week 1): Met Week 2:  OT Short Term Goal 1 (Week 2): STGs = LTGs      Skilled Therapeutic Interventions/Progress Updates:    Pt received in w/c already bathed and dressed with A from nursing.  Pt needed to don shoes.  Worked with shoe funnel and shoe buttons to don L shoe with mod I.  The R shoe has the AFO attachment so pt needed mod A even with AE.   Pt was then taken to gym to work on [employer].  From w/c level, she worked on actively using R hand to place pillow cases on 2 pillows with min A. She transitioned to mat with steadying A with RW and moved into sidelying.  With R hand actively holding end of walking stick, pt worked on push pull and rotation exercises to increase shoulder AROM.  Pt now has full finger flexion and extension but only 20 degrees of sh flex AROM.  She then moved into supine to work with B hands on walking stick with pushing arms up at chest level and holding R arm extended with place and holds with min support  proximally.  Pt moved back into sitting for a/arom of elbow extension with stretching arm forward.  Pt transferred back to w/c and taken back to room with chair alarm on and all needs met.  Call light in reach.  Therapy Documentation Precautions:  Precautions Precautions: Fall Precaution Comments: pt reports several falls Restrictions Weight Bearing Restrictions: No   Pain: Pain Assessment Pain Score: 0-No pain ADL: ADL ADL Comments: refer to functional navigator    See Function Navigator for Current Functional Status.   Therapy/Group: Individual Therapy  Prynce Jacober 02/16/2018, 12:08 PM

## 2018-02-16 NOTE — Progress Notes (Signed)
Physical Therapy Session Note  Patient Details  Name: Brandy Barnett MRN: [id number] Date of Birth: [birth year]  Today's Date: 02/16/2018 PT Individual Time: 1000-1100 AND 1600-1650 PT Individual Time Calculation (min): 60 min AND 50 min  Short Term Goals: Week 2:  PT Short Term Goal 1 (Week 2): STG=LTG due to ELOS  Skilled Therapeutic Interventions/Progress Updates:   Session 1:  Pt in w/c and agreeable to therapy, no c/o pain. Session focused on functional mobility in household environment. Total assist w/c transport to/from gym for time management. Performed blocked practice of stand pivot transfers w/ RW, min guard fading to close supervision. Much improved RW management this session with turning. Discussed home environment w/ bathroom and bedroom set-up. Pt wishing to not have her son help her for bathing/dressing, problem solved how she would do this with the most independence possible. Educated on safety risks with this and setting up environment beforehand. Additionally practiced multiple transfers w/o AFO to mimic getting in and out of shower w/o shoes/AFO, min guard-close supervision as well. Practiced gait in household environment, pt weaved around both cones and obstacle course w/ RW and min guard w/ frequent verbal cues for increased R foot clearance. Pt w/ increased fatigue performing gait and standing activities, performed NuStep 5 min x2 @ level 3 for endurance training. Returned to room, ended session in supine, all needs in reach.   Session 2:  Pt in w/c and agreeable to therapy, denies pain. Instructed pt on self-propelling w/c w/ BLEs for increased LE coordination and strengthening, self-propelled 150' to gym w/o resting. Worked on postural control and standing balance w/o UE support while performing clothespin task. Assist needed to reach forward w/ RUE, none w/ LUE, min guard for balance during task. Task emphasized anterior reaching and trunk rotation, occasional verbal cues for  hip balance strategies and weight shifting. Worked on hip abductor activation in standing to decreased hip drop in R single leg stance. Performed hip abduction raises 2x10 both sides w/ tactile and verbal cues for R hip abductor activation in both open and closed chain exercise. Performed side-stepping at rail w/ min guard and same verbal/tactile cues as listed above. Returned to room via w/c, ended session in w/c and all needs in reach.  Therapy Documentation Precautions:  Precautions Precautions: Fall Precaution Comments: pt reports several falls Restrictions Weight Bearing Restrictions: No Pain:    See Function Navigator for Current Functional Status.   Therapy/Group: Individual Therapy  [name] 02/16/2018, 12:01 PM

## 2018-02-17 ENCOUNTER — Inpatient Hospital Stay (HOSPITAL_COMMUNITY): Payer: BLUE CROSS/BLUE SHIELD | Admitting: Physical Therapy

## 2018-02-17 DIAGNOSIS — N184 Chronic kidney disease, stage 4 (severe): Secondary | ICD-10-CM

## 2018-02-17 DIAGNOSIS — D509 Iron deficiency anemia, unspecified: Secondary | ICD-10-CM

## 2018-02-17 DIAGNOSIS — I10 Essential (primary) hypertension: Secondary | ICD-10-CM

## 2018-02-17 LAB — GLUCOSE, CAPILLARY
GLUCOSE-CAPILLARY: 86 mg/dL (ref 70–99)
Glucose-Capillary: 91 mg/dL (ref 70–99)
Glucose-Capillary: 100 mg/dL — ABNORMAL HIGH (ref 70–99)
Glucose-Capillary: 153 mg/dL — ABNORMAL HIGH (ref 70–99)

## 2018-02-17 NOTE — Progress Notes (Signed)
Physical Therapy Session Note  Patient Details  Name: Brandy Barnett MRN: [id number] Date of Birth: [birth year]  Today's Date: 02/17/2018 PT Individual Time: 1100-1155 PT Individual Time Calculation (min): 55 min   Short Term Goals: Week 2:  PT Short Term Goal 1 (Week 2): STG=LTG due to ELOS  Skilled Therapeutic Interventions/Progress Updates:   Pt in supine and agreeable to therapy, denies pain. Transferred to EOB w/ min assist to elevate trunk and to w/c via stand pivot w/ min guard (no AD). Total assist to don shoes and RAFO. Pt self-propelled w/c to therapy gym w/ BLEs to work on LE strengthening and overall endurance, >150' w/o needing a rest break and w/ increased time. Performed blocked practice of stand pivot transfers w/o AD to both directions, w/c<>mat. Min guard at first fading to close supervision, w/ verbal and visual cues for technique especially w/ controlled descent to chair/mat. Performed blocked practice of transfers w/ RW as well to work on managing RW while turning, close supervision w/ verbal cues for these as well. Worked on gait in household environment while navigating cones and tight spaces. Min guard to close supervision throughout w/ occasional cues for increased R foot clearance. Worked on R hip muscle activation in supine remainder of session 2/2 increased c/o fatigue this date. Performed supine bridges x10, and 2x10 w/ orange theraband providing resistance to hold LEs into neutral abduction. Tactile cues for TA activation and R hip abductor activation. Performed supine heel slides w/ tactile cues for R hip abduction and verbal cues for increased range. Increased R hip tightness/stiffness sensed in all directions w/ passive movement, suspect 2/2 disuse and history of R hip replacement. Returned to room and ended session in w/c, call bell within reach and all needs met.   Therapy Documentation Precautions:  Precautions Precautions: Fall Precaution Comments: pt reports  several falls Restrictions Weight Bearing Restrictions: No Pain: Pain Assessment Pain Scale: 0-10 Pain Score: 0-No pain  See Function Navigator for Current Functional Status.   Therapy/Group: Individual Therapy  Kalijah Westfall K Arnette 02/17/2018, 12:02 PM

## 2018-02-17 NOTE — Progress Notes (Signed)
Subjective/Complaints: Patient seen lying in bed this morning. She states she slept well overnight.  Review of systems: Denies CP, SOB, N/V/D  Objective: Vital Signs: Blood pressure (!) 146/67, pulse 78, temperature 98 F (36.7 C), temperature source Oral, resp. rate 19, height '5\' 2"'$  (1.575 m), weight 64 kg, SpO2 100 %. No results found. Results for orders placed or performed during the hospital encounter of 02/06/18 (from the past 72 hour(s))  Glucose, capillary     Status: Abnormal   Collection Time: 02/14/18 11:20 AM  Result Value Ref Range   Glucose-Capillary 146 (H) 70 - 99 mg/dL  Glucose, capillary     Status: Abnormal   Collection Time: 02/14/18  4:31 PM  Result Value Ref Range   Glucose-Capillary 106 (H) 70 - 99 mg/dL  Glucose, capillary     Status: None   Collection Time: 02/14/18  9:24 PM  Result Value Ref Range   Glucose-Capillary 98 70 - 99 mg/dL  Glucose, capillary     Status: None   Collection Time: 02/15/18  7:01 AM  Result Value Ref Range   Glucose-Capillary 97 70 - 99 mg/dL  Glucose, capillary     Status: Abnormal   Collection Time: 02/15/18 11:35 AM  Result Value Ref Range   Glucose-Capillary 106 (H) 70 - 99 mg/dL   Comment 1 Notify RN   Glucose, capillary     Status: Abnormal   Collection Time: 02/15/18  4:54 PM  Result Value Ref Range   Glucose-Capillary 126 (H) 70 - 99 mg/dL   Comment 1 Notify RN   Glucose, capillary     Status: None   Collection Time: 02/15/18  9:44 PM  Result Value Ref Range   Glucose-Capillary 79 70 - 99 mg/dL  Glucose, capillary     Status: Abnormal   Collection Time: 02/16/18  6:50 AM  Result Value Ref Range   Glucose-Capillary 105 (H) 70 - 99 mg/dL  Basic metabolic panel     Status: Abnormal   Collection Time: 02/16/18  7:02 AM  Result Value Ref Range   Sodium 142 135 - 145 mmol/L   Potassium 4.2 3.5 - 5.1 mmol/L   Chloride 110 98 - 111 mmol/L   CO2 23 22 - 32 mmol/L   Glucose, Bld 103 (H) 70 - 99 mg/dL   BUN 51 (H) 8  - 23 mg/dL   Creatinine, Ser 2.05 (H) 0.44 - 1.00 mg/dL   Calcium 9.0 8.9 - 10.3 mg/dL   GFR calc non Af Amer 21 (L) >60 mL/min   GFR calc Af Amer 25 (L) >60 mL/min    Comment: (NOTE) The eGFR has been calculated using the CKD EPI equation. This calculation has not been validated in all clinical situations. eGFR's persistently <60 mL/min signify possible Chronic Kidney Disease.    Anion gap 9 5 - 15    Comment: Performed at [facility] [address], [place], [place] [address]  Glucose, capillary     Status: Abnormal   Collection Time: 02/16/18 11:56 AM  Result Value Ref Range   Glucose-Capillary 103 (H) 70 - 99 mg/dL   Comment 1 Notify RN   Glucose, capillary     Status: Abnormal   Collection Time: 02/16/18  5:19 PM  Result Value Ref Range   Glucose-Capillary 153 (H) 70 - 99 mg/dL   Comment 1 Notify RN   Glucose, capillary     Status: None   Collection Time: 02/16/18  9:19 PM  Result Value  Ref Range   Glucose-Capillary 75 70 - 99 mg/dL   Comment 1 Notify RN   Glucose, capillary     Status: None   Collection Time: 02/17/18  6:31 AM  Result Value Ref Range   Glucose-Capillary 91 70 - 99 mg/dL   Comment 1 Notify RN      Constitutional: No distress . Vital signs reviewed. HENT: Normocephalic.  Atraumatic. Eyes: EOMI. No discharge. Cardiovascular: RRR. No JVD. Respiratory: CTA Bilaterally. Normal effort. GI: BS +. Non-distended. Musc: No edema or tenderness in extremities. Skin:   Intact. Warm and dry. Neuro: Alert and oreinted Motor: 3/5 right deltoid, bicep, tricep, grip 4/5 left deltoid, biceps, triceps, grip  Assessment/Plan: 1. Functional deficits secondary to small left subcortical infarct not noted on MRI which require 3+ hours per day of interdisciplinary therapy in a comprehensive inpatient rehab setting. Physiatrist is providing close team supervision and 24 hour management of active medical problems listed below. Physiatrist and rehab team continue to  assess barriers to discharge/monitor patient progress toward functional and medical goals. FIM: Function - Bathing Position: Shower Body parts bathed by patient: Right arm, Chest, Abdomen, Right upper leg, Left upper leg, Left lower leg, Right lower leg, Front perineal area, Buttocks Body parts bathed by helper: Left arm, Back Assist Level: (partial/moderate assist)  Function- Upper Body Dressing/Undressing What is the patient wearing?: Bra, Pull over shirt/dress Bra - Perfomed by patient: Thread/unthread right bra strap, Thread/unthread left bra strap Bra - Perfomed by helper: Hook/unhook bra (pull down sports bra) Pull over shirt/dress - Perfomed by patient: Thread/unthread left sleeve, Thread/unthread right sleeve, Put head through opening, Pull shirt over trunk Pull over shirt/dress - Perfomed by helper: Thread/unthread right sleeve, Thread/unthread left sleeve(long sleeved shirt) Assist Level: (max assist) Function - Lower Body Dressing/Undressing What is the patient wearing?: Pants, Non-skid slipper socks, AFO, Shoes Position: Wheelchair/chair at sink Underwear - Performed by helper: Thread/unthread right underwear leg, Thread/unthread left underwear leg, Pull underwear up/down Pants- Performed by patient: Thread/unthread right pants leg, Thread/unthread left pants leg Pants- Performed by helper: Pull pants up/down Non-skid slipper socks- Performed by helper: Don/doff right sock, Don/doff left sock Shoes - Performed by patient: Don/doff left shoe, Fasten left, Fasten right Shoes - Performed by helper: Don/doff right shoe AFO - Performed by helper: Don/doff right AFO Assist for footwear: Partial/moderate assist Assist for lower body dressing: Touching or steadying assistance (Pt > 75%)  Function - Toileting Toileting steps completed by patient: Adjust clothing prior to toileting, Performs perineal hygiene Toileting steps completed by helper: Performs perineal hygiene, Adjust  clothing after toileting Toileting Assistive Devices: Grab bar or rail Assist level: Touching or steadying assistance (Pt.75%)  Function - Air cabin crew transfer assistive device: Grab bar, Walker, Elevated toilet seat/BSC over toilet Assist level to toilet: Touching or steadying assistance (Pt > 75%) Assist level from toilet: Touching or steadying assistance (Pt > 75%) Assist level to bedside commode (at bedside): Moderate assist (Pt 50 - 74%/lift or lower)(per Elby Beck, NT and Elmo Putt, NT) Assist level from bedside commode (at bedside): Moderate assist (Pt 50 - 74%/lift or lower)  Function - Chair/bed transfer Chair/bed transfer method: Stand pivot Chair/bed transfer assist level: Moderate assist (Pt 50 - 74%/lift or lower) Chair/bed transfer assistive device: Armrests, Walker Chair/bed transfer details: Verbal cues for technique, Verbal cues for precautions/safety, Manual facilitation for placement  Function - Locomotion: Wheelchair Will patient use wheelchair at discharge?: No Type: Manual Max wheelchair distance: 150' Assist Level: Supervision or verbal  cues Assist Level: Supervision or verbal cues Assist Level: Supervision or verbal cues Turns around,maneuvers to table,bed, and toilet,negotiates 3% grade,maneuvers on rugs and over doorsills: No Function - Locomotion: Ambulation Assistive device: Walker-rolling Max distance: 68' Assist level: Touching or steadying assistance (Pt > 75%) Assist level: Touching or steadying assistance (Pt > 75%) Walk 50 feet with 2 turns activity did not occur: Safety/medical concerns Assist level: Touching or steadying assistance (Pt > 75%) Walk 150 feet activity did not occur: Safety/medical concerns Walk 10 feet on uneven surfaces activity did not occur: Safety/medical concerns  Function - Comprehension Comprehension: Auditory Comprehension assist level: Follows basic conversation/direction with no assist  Function -  Expression Expression: Verbal Expression assist level: Expresses basic needs/ideas: With no assist  Function - Social Interaction Social Interaction assist level: Interacts appropriately with others with medication or extra time (anti-anxiety, antidepressant).  Function - Problem Solving Problem solving assist level: Solves basic 90% of the time/requires cueing < 10% of the time  Function - Memory Memory assist level: Recognizes or recalls 90% of the time/requires cueing < 10% of the time Patient normally able to recall (first 3 days only): Current season, Location of own room, Staff names and faces, That he or she is in a hospital  Medical Problem List and Plan: 1.Functional deficits due to RUE>RLE weaknesssecondary to small subcortical infarct  Cont CIR, plan is home  With 24/7 sup      2. DVT Prophylaxis/Anticoagulation: Pharmaceutical:Lovenox 3. Pain Management:Tylenol prn for HA. Tramadol prn for moderate to severe pain. Kpad and sportscreme for neck 4. Mood:LCSW to follow for evaluation and and support 5. Neuropsych: This patientiscapable of making decisions onherown behalf. 6. Skin/Wound Care:Routine pressure relief measures 7. Fluids/Electrolytes/Nutrition:Monitor I's and O's.  8.HTN: Monitor: Blood pressures twice daily. Avoid hypotension to allow for adequate perfusion. Will continue Norvasc daily Vitals:   02/16/18 2019 02/17/18 0523  BP: 122/64 (!) 146/67  Pulse: 70 78  Resp: 19 19  Temp: 98.1 F (36.7 C) 98 F (36.7 C)  SpO2: 95% 100%   Elevated this a.m., otherwise controlled 9.T2DM: We will monitor blood sugars before meals at bedtime. Resumed home dose Glucotrol. CBG (last 3)   Recent Labs    02/16/18 1719 02/16/18 2119 02/17/18 0631  GLUCAP 153* 75 91   Overall controlled on 9/14 10.Chronic low back pain: Resume Cymbalta. Question tramadol as needed for pain 11.Iron deficiency anemia:   Hemoglobin 10.3 on 9/4  Labs ordered for  Monday 12.CKD stage IV: BUN/SCr 52/1.7 at admission.  Creat up to 2.05 on 9/13  Continue IVF daily at bedtime  Labs ordered for Monday  Has nephro f/u in [facility] in Nov may need to move up appt to Oct  LOS (Days) 11 A FACE TO FACE EVALUATION WAS PERFORMED  Shatiqua Heroux Lorie Phenix 02/17/2018, 11:17 AM

## 2018-02-18 ENCOUNTER — Inpatient Hospital Stay (HOSPITAL_COMMUNITY): Payer: BLUE CROSS/BLUE SHIELD

## 2018-02-18 ENCOUNTER — Inpatient Hospital Stay (HOSPITAL_COMMUNITY): Payer: BLUE CROSS/BLUE SHIELD | Admitting: Physical Therapy

## 2018-02-18 DIAGNOSIS — E119 Type 2 diabetes mellitus without complications: Secondary | ICD-10-CM

## 2018-02-18 LAB — GLUCOSE, CAPILLARY
GLUCOSE-CAPILLARY: 89 mg/dL (ref 70–99)
GLUCOSE-CAPILLARY: 85 mg/dL (ref 70–99)
Glucose-Capillary: 121 mg/dL — ABNORMAL HIGH (ref 70–99)
Glucose-Capillary: 117 mg/dL — ABNORMAL HIGH (ref 70–99)

## 2018-02-18 NOTE — Progress Notes (Signed)
Physical Therapy Session Note  Patient Details  Name: Brandy CahillLu S Coppess MRN: [id number] Date of Birth: Jun 01, 1933  Today's Date: 02/18/2018 PT Individual Time: 1130-1200 PT Individual Time Calculation (min): 30 min   Short Term Goals: Week 2:  PT Short Term Goal 1 (Week 2): STG=LTG due to ELOS  Skilled Therapeutic Interventions/Progress Updates:   Pt in supine and agreeable to therapy, denies pain but is requesting to toilet. Transferred to EOB w/ min assist and ambulated to/from toilet w/ min guard using RW and w/o shoes or RAFO. Practiced w/o shoes/AFO to simulate home environment if she were to wake up in middle of the night. Min assist for LE garment management during toileting, otherwise supervision sit<>stands to RW. Pt self-propelled w/c to day room w/ supervision using BLEs for LE strengthening. Provided 1:1 instruction while pt participated in therapeutic dance group. Pt performed rhythmic LE movements to music including knee marches, toe taps, and side stepping. Rhythmic UE movements included clapping hands while working on trunk rotation in seated position. Verbal, visual, and tactile cues for technique. Returned to room and ended session in w/c, all needs in reach.    Therapy Documentation Precautions:  Precautions Precautions: Fall Precaution Comments: pt reports several falls Restrictions Weight Bearing Restrictions: No Pain: Pain Assessment Pain Scale: 0-10 Pain Score: 0-No pain  See Function Navigator for Current Functional Status.   Therapy/Group: Individual Therapy  Mahima Hottle K Arnette 02/18/2018, 12:09 PM

## 2018-02-18 NOTE — Progress Notes (Signed)
Subjective/Complaints: Patient seen laying in bed this morning. She states she slept well overnight. She denies complaints. She is pleasant.  Review of systems: Denies CP, SOB, N/V/D  Objective: Vital Signs: Blood pressure 120/70, pulse 74, temperature 98 F (36.7 C), temperature source Oral, resp. rate 19, height '5\' 2"'$  (1.575 m), weight 65.5 kg, SpO2 98 %. No results found. Results for orders placed or performed during the hospital encounter of 02/06/18 (from the past 72 hour(s))  Glucose, capillary     Status: Abnormal   Collection Time: 02/15/18 11:35 AM  Result Value Ref Range   Glucose-Capillary 106 (H) 70 - 99 mg/dL   Comment 1 Notify RN   Glucose, capillary     Status: Abnormal   Collection Time: 02/15/18  4:54 PM  Result Value Ref Range   Glucose-Capillary 126 (H) 70 - 99 mg/dL   Comment 1 Notify RN   Glucose, capillary     Status: None   Collection Time: 02/15/18  9:44 PM  Result Value Ref Range   Glucose-Capillary 79 70 - 99 mg/dL  Glucose, capillary     Status: Abnormal   Collection Time: 02/16/18  6:50 AM  Result Value Ref Range   Glucose-Capillary 105 (H) 70 - 99 mg/dL  Basic metabolic panel     Status: Abnormal   Collection Time: 02/16/18  7:02 AM  Result Value Ref Range   Sodium 142 135 - 145 mmol/L   Potassium 4.2 3.5 - 5.1 mmol/L   Chloride 110 98 - 111 mmol/L   CO2 23 22 - 32 mmol/L   Glucose, Bld 103 (H) 70 - 99 mg/dL   BUN 51 (H) 8 - 23 mg/dL   Creatinine, Ser 2.05 (H) 0.44 - 1.00 mg/dL   Calcium 9.0 8.9 - 10.3 mg/dL   GFR calc non Af Amer 21 (L) >60 mL/min   GFR calc Af Amer 25 (L) >60 mL/min    Comment: (NOTE) The eGFR has been calculated using the CKD EPI equation. This calculation has not been validated in all clinical situations. eGFR's persistently <60 mL/min signify possible Chronic Kidney Disease.    Anion gap 9 5 - 15    Comment: Performed at [facility] [address], [place], [place] [address]  Glucose, capillary     Status:  Abnormal   Collection Time: 02/16/18 11:56 AM  Result Value Ref Range   Glucose-Capillary 103 (H) 70 - 99 mg/dL   Comment 1 Notify RN   Glucose, capillary     Status: Abnormal   Collection Time: 02/16/18  5:19 PM  Result Value Ref Range   Glucose-Capillary 153 (H) 70 - 99 mg/dL   Comment 1 Notify RN   Glucose, capillary     Status: None   Collection Time: 02/16/18  9:19 PM  Result Value Ref Range   Glucose-Capillary 75 70 - 99 mg/dL   Comment 1 Notify RN   Glucose, capillary     Status: None   Collection Time: 02/17/18  6:31 AM  Result Value Ref Range   Glucose-Capillary 91 70 - 99 mg/dL   Comment 1 Notify RN   Glucose, capillary     Status: Abnormal   Collection Time: 02/17/18 11:58 AM  Result Value Ref Range   Glucose-Capillary 100 (H) 70 - 99 mg/dL  Glucose, capillary     Status: Abnormal   Collection Time: 02/17/18  4:37 PM  Result Value Ref Range   Glucose-Capillary 153 (H) 70 - 99 mg/dL  Glucose, capillary     Status: None   Collection Time: 02/17/18  9:28 PM  Result Value Ref Range   Glucose-Capillary 86 70 - 99 mg/dL  Glucose, capillary     Status: None   Collection Time: 02/18/18  6:31 AM  Result Value Ref Range   Glucose-Capillary 89 70 - 99 mg/dL     Constitutional: No distress . Vital signs reviewed. HENT: Normocephalic.  Atraumatic. Eyes: EOMI. No discharge. Cardiovascular: RRR. No JVD. Respiratory: CTA bilaterally. Normal effort. GI: BS +. Non-distended. Musc: No edema or tenderness in extremities. Skin:   Intact. Warm and dry. Neuro: Alert and oreinted Motor: 3/5 RUE/RLE 4/5 left deltoid, biceps, triceps, grip  Assessment/Plan: 1. Functional deficits secondary to small left subcortical infarct not noted on MRI which require 3+ hours per day of interdisciplinary therapy in a comprehensive inpatient rehab setting. Physiatrist is providing close team supervision and 24 hour management of active medical problems listed below. Physiatrist and rehab team  continue to assess barriers to discharge/monitor patient progress toward functional and medical goals. FIM: Function - Bathing Position: Shower Body parts bathed by patient: Right arm, Chest, Abdomen, Right upper leg, Left upper leg, Left lower leg, Right lower leg, Front perineal area, Buttocks Body parts bathed by helper: Left arm, Back Assist Level: (partial/moderate assist)  Function- Upper Body Dressing/Undressing What is the patient wearing?: Bra, Pull over shirt/dress Bra - Perfomed by patient: Thread/unthread right bra strap, Thread/unthread left bra strap Bra - Perfomed by helper: Hook/unhook bra (pull down sports bra) Pull over shirt/dress - Perfomed by patient: Thread/unthread left sleeve, Thread/unthread right sleeve, Put head through opening, Pull shirt over trunk Pull over shirt/dress - Perfomed by helper: Thread/unthread right sleeve, Thread/unthread left sleeve(long sleeved shirt) Assist Level: (max assist) Function - Lower Body Dressing/Undressing What is the patient wearing?: Pants, Non-skid slipper socks, AFO, Shoes Position: Wheelchair/chair at sink Underwear - Performed by helper: Thread/unthread right underwear leg, Thread/unthread left underwear leg, Pull underwear up/down Pants- Performed by patient: Thread/unthread right pants leg, Thread/unthread left pants leg Pants- Performed by helper: Pull pants up/down Non-skid slipper socks- Performed by helper: Don/doff right sock, Don/doff left sock Shoes - Performed by patient: Don/doff left shoe, Fasten left, Fasten right Shoes - Performed by helper: Don/doff right shoe AFO - Performed by helper: Don/doff right AFO Assist for footwear: Partial/moderate assist Assist for lower body dressing: Touching or steadying assistance (Pt > 75%)  Function - Toileting Toileting steps completed by patient: Adjust clothing prior to toileting, Performs perineal hygiene Toileting steps completed by helper: Performs perineal hygiene,  Adjust clothing after toileting Toileting Assistive Devices: Grab bar or rail Assist level: Touching or steadying assistance (Pt.75%)  Function - Air cabin crew transfer assistive device: Grab bar, Walker, Elevated toilet seat/BSC over toilet Assist level to toilet: Touching or steadying assistance (Pt > 75%) Assist level from toilet: Touching or steadying assistance (Pt > 75%) Assist level to bedside commode (at bedside): Moderate assist (Pt 50 - 74%/lift or lower)(per Elby Beck, NT and Elmo Putt, NT) Assist level from bedside commode (at bedside): Moderate assist (Pt 50 - 74%/lift or lower)  Function - Chair/bed transfer Chair/bed transfer method: Stand pivot Chair/bed transfer assist level: Touching or steadying assistance (Pt > 75%) Chair/bed transfer assistive device: Armrests, Walker Chair/bed transfer details: Verbal cues for technique, Verbal cues for precautions/safety, Manual facilitation for placement  Function - Locomotion: Wheelchair Will patient use wheelchair at discharge?: No Type: Manual Max wheelchair distance: 150' Assist Level: Supervision or verbal  cues Assist Level: Supervision or verbal cues Assist Level: Supervision or verbal cues Turns around,maneuvers to table,bed, and toilet,negotiates 3% grade,maneuvers on rugs and over doorsills: No Function - Locomotion: Ambulation Assistive device: Walker-rolling Max distance: 22' Assist level: Touching or steadying assistance (Pt > 75%) Assist level: Touching or steadying assistance (Pt > 75%) Walk 50 feet with 2 turns activity did not occur: Safety/medical concerns Assist level: Touching or steadying assistance (Pt > 75%) Walk 150 feet activity did not occur: Safety/medical concerns Walk 10 feet on uneven surfaces activity did not occur: Safety/medical concerns  Function - Comprehension Comprehension: Auditory Comprehension assist level: Follows basic conversation/direction with no assist  Function -  Expression Expression: Verbal Expression assist level: Expresses basic needs/ideas: With no assist  Function - Social Interaction Social Interaction assist level: Interacts appropriately with others with medication or extra time (anti-anxiety, antidepressant).  Function - Problem Solving Problem solving assist level: Solves basic 90% of the time/requires cueing < 10% of the time  Function - Memory Memory assist level: Recognizes or recalls 90% of the time/requires cueing < 10% of the time Patient normally able to recall (first 3 days only): Current season, Location of own room, Staff names and faces, That he or she is in a hospital  Medical Problem List and Plan: 1.Functional deficits due to RUE>RLE weaknesssecondary to small subcortical infarct  Cont CIR, plan is home  With 24/7 sup      2. DVT Prophylaxis/Anticoagulation: Pharmaceutical:Lovenox 3. Pain Management:Tylenol prn for HA. Tramadol prn for moderate to severe pain. Kpad and sportscreme for neck 4. Mood:LCSW to follow for evaluation and and support 5. Neuropsych: This patientiscapable of making decisions onherown behalf. 6. Skin/Wound Care:Routine pressure relief measures 7. Fluids/Electrolytes/Nutrition:Monitor I's and O's.  8.HTN: Monitor: Blood pressures twice daily. Avoid hypotension to allow for adequate perfusion. Will continue Norvasc daily Vitals:   02/17/18 2034 02/18/18 0349  BP: 139/67 120/70  Pulse: 81 74  Resp: 16 19  Temp: 98 F (36.7 C)   SpO2: 95% 98%   Controlled on 9/15 9.T2DM: We will monitor blood sugars before meals at bedtime. Resumed home dose Glucotrol. CBG (last 3)   Recent Labs    02/17/18 1637 02/17/18 2128 02/18/18 0631  GLUCAP 153* 86 89   Relatively controlled on 9/15 10.Chronic low back pain: Resume Cymbalta. Question tramadol as needed for pain 11.Iron deficiency anemia:   Hemoglobin 10.3 on 9/4  Labs ordered for Monday 12.CKD stage IV: BUN/SCr 52/1.7 at  admission.  Creat up to 2.05 on 9/13  Continue IVF daily at bedtime  Labs ordered for tomorrow  Has nephro f/u in [facility] in Nov may need to move up appt to Oct  LOS (Days) 12 A FACE TO FACE EVALUATION WAS PERFORMED  English [place] Lorie Phenix 02/18/2018, 8:12 AM

## 2018-02-18 NOTE — Progress Notes (Signed)
Occupational Therapy Session Note  Patient Details  Name: Brandy CahillLu S Barnett MRN: [id number] Date of Birth: Nov 10, 1932  Today's Date: 02/18/2018 OT Individual Time: [id number] OT Individual Time Calculation (min): 72 min    Skilled Therapeutic Interventions/Progress Updates:    1:1. Pt ambuatory transfer EOB>TTB>w/c with RW and CGA with tactile cues for control of knee hyperextension. Pt completes bathing with VC for use of soap on washcloth and HOH A of RUE to wash LUE. Pt dresses at sit to stand level with VC for hemi techniques when threading BLE into pants. Pt requires MAX A to don front clasp bra, but pt able to don shirt with min A for threading RUE. Pt grooms seated with supervision and VC for one handed toothpaste application/use of RUE for stabilization. Pt able to don L sock in figure 4 with 1 handed technqiues but OT required to don A d/t decreased R hip flexibility. Exited session with pt returned to bed with exit alarm on and call light inr each  Therapy Documentation Precautions:  Precautions Precautions: Fall Precaution Comments: pt reports several falls Restrictions Weight Bearing Restrictions: No  See Function Navigator for Current Functional Status.   Therapy/Group: Individual Therapy  Shon HaleStephanie M Holli Rengel 02/18/2018, 3:16 PM

## 2018-02-19 ENCOUNTER — Inpatient Hospital Stay (HOSPITAL_COMMUNITY): Payer: BLUE CROSS/BLUE SHIELD

## 2018-02-19 ENCOUNTER — Inpatient Hospital Stay (HOSPITAL_COMMUNITY): Payer: Self-pay | Admitting: Physical Therapy

## 2018-02-19 ENCOUNTER — Inpatient Hospital Stay (HOSPITAL_COMMUNITY): Payer: Self-pay

## 2018-02-19 ENCOUNTER — Inpatient Hospital Stay (HOSPITAL_COMMUNITY): Payer: BLUE CROSS/BLUE SHIELD | Admitting: Occupational Therapy

## 2018-02-19 LAB — BASIC METABOLIC PANEL
ANION GAP: 15 (ref 5–15)
BUN: 61 mg/dL — ABNORMAL HIGH (ref 8–23)
CO2: 17 mmol/L — ABNORMAL LOW (ref 22–32)
Calcium: 9.3 mg/dL (ref 8.9–10.3)
Chloride: 112 mmol/L — ABNORMAL HIGH (ref 98–111)
Creatinine, Ser: 2.04 mg/dL — ABNORMAL HIGH (ref 0.44–1.00)
GFR calc Af Amer: 25 mL/min — ABNORMAL LOW (ref >60)
GFR, EST NON AFRICAN AMERICAN: 21 mL/min — AB (ref >60)
Glucose, Bld: 179 mg/dL — ABNORMAL HIGH (ref 70–99)
POTASSIUM: 4.1 mmol/L (ref 3.5–5.1)
Sodium: 144 mmol/L (ref 135–145)

## 2018-02-19 LAB — CBC WITH DIFFERENTIAL/PLATELET
ABS IMMATURE GRANULOCYTES: 0.1 10*3/uL (ref 0.0–0.1)
Basophils Absolute: 0.1 10*3/uL (ref 0.0–0.1)
Basophils Relative: 1 %
Eosinophils Absolute: 0.4 10*3/uL (ref 0.0–0.7)
Eosinophils Relative: 3 %
HEMATOCRIT: 34 % — AB (ref 36.0–46.0)
Hemoglobin: 11.1 g/dL — ABNORMAL LOW (ref 12.0–15.0)
IMMATURE GRANULOCYTES: 1 %
Lymphocytes Relative: 22 %
Lymphs Abs: 2.6 10*3/uL (ref 0.7–4.0)
MCH: 30 pg (ref 26.0–34.0)
MCHC: 32.6 g/dL (ref 30.0–36.0)
MCV: 91.9 fL (ref 78.0–100.0)
MONOS PCT: 6 %
Monocytes Absolute: 0.6 10*3/uL (ref 0.1–1.0)
NEUTROS ABS: 7.9 10*3/uL — AB (ref 1.7–7.7)
NEUTROS PCT: 67 %
Platelets: 276 10*3/uL (ref 150–400)
RBC: 3.7 MIL/uL — ABNORMAL LOW (ref 3.87–5.11)
RDW: 12.8 % (ref 11.5–15.5)
WBC: 11.7 10*3/uL — ABNORMAL HIGH (ref 4.0–10.5)

## 2018-02-19 LAB — GLUCOSE, CAPILLARY
GLUCOSE-CAPILLARY: 98 mg/dL (ref 70–99)
GLUCOSE-CAPILLARY: 102 mg/dL — AB (ref 70–99)
Glucose-Capillary: 123 mg/dL — ABNORMAL HIGH (ref 70–99)
Glucose-Capillary: 172 mg/dL — ABNORMAL HIGH (ref 70–99)
Glucose-Capillary: 109 mg/dL — ABNORMAL HIGH (ref 70–99)

## 2018-02-19 MED ORDER — AMLODIPINE BESYLATE 5 MG PO TABS
5.0000 mg | ORAL_TABLET | Freq: Every day | ORAL | Status: DC
  Administered 2018-02-20 – 2018-02-21 (×2): 5 mg via ORAL
  Filled 2018-02-19 (×2): qty 1

## 2018-02-19 NOTE — Progress Notes (Signed)
Physical Therapy Session Note  Patient Details  Name: Brandy Barnett S Strahm MRN: [id number] Date of Birth: [birth year]  Today's Date: 02/19/2018 PT Individual Time: 0900-0958 PT Individual Time Calculation (min): 58 min   Short Term Goals: Week 2:  PT Short Term Goal 1 (Week 2): STG=LTG due to ELOS  Skilled Therapeutic Interventions/Progress Updates:   Pt in w/c and agreeable to therapy, no c/o pain. Total assist w/c transport to/from therapy gym for time management. Worked on R hip abduction activation this session for decreased L hip drop during gait. Performed 2" R lateral step ups in parallel bars w/ tactile cues for R abduction activation, 3x10 w/ UE support and 1x10 w/o UE support. Mod assist to prevent LOB w/o UE support. Performed side stepping up and down parallel bars w/ min guard x4 reps. Performed supine exercises w/ emphasis on glut musculature: supine brides 3x5, supine bridge RLE only 2x5, supine bridges w/ orange theraband 2x5, and orange theraband resisted R/L abduction in hooklying 3x10. Pt self-propelled w/c w/ BLEs on way back to room. Ended session in w/c, all needs in reach.  Therapy Documentation Precautions:  Precautions Precautions: Fall Precaution Comments: pt reports several falls Restrictions Weight Bearing Restrictions: No Vital Signs:   Pain: Pain Assessment Pain Scale: 0-10 Pain Score: 0-No pain  See Function Navigator for Current Functional Status.   Therapy/Group: Individual Therapy  Micheline Markes K Arnette 02/19/2018, 9:59 AM

## 2018-02-19 NOTE — Progress Notes (Signed)
Subjective/Complaints:  No issues overnite, discussed D/C plans  Review of systems: Denies CP, SOB, N/V/D  Objective: Vital Signs: Blood pressure 138/74, pulse 77, temperature 98.3 F (36.8 C), temperature source Oral, resp. rate 16, height 5\' 2"  (1.575 m), weight 65.5 kg, SpO2 98 %. No results found. Results for orders placed or performed during the hospital encounter of 02/06/18 (from the past 72 hour(s))  Glucose, capillary     Status: Abnormal   Collection Time: 02/16/18 11:56 AM  Result Value Ref Range   Glucose-Capillary 103 (H) 70 - 99 mg/dL   Comment 1 Notify RN   Glucose, capillary     Status: Abnormal   Collection Time: 02/16/18  5:19 PM  Result Value Ref Range   Glucose-Capillary 153 (H) 70 - 99 mg/dL   Comment 1 Notify RN   Glucose, capillary     Status: None   Collection Time: 02/16/18  9:19 PM  Result Value Ref Range   Glucose-Capillary 75 70 - 99 mg/dL   Comment 1 Notify RN   Glucose, capillary     Status: None   Collection Time: 02/17/18  6:31 AM  Result Value Ref Range   Glucose-Capillary 91 70 - 99 mg/dL   Comment 1 Notify RN   Glucose, capillary     Status: Abnormal   Collection Time: 02/17/18 11:58 AM  Result Value Ref Range   Glucose-Capillary 100 (H) 70 - 99 mg/dL  Glucose, capillary     Status: Abnormal   Collection Time: 02/17/18  4:37 PM  Result Value Ref Range   Glucose-Capillary 153 (H) 70 - 99 mg/dL  Glucose, capillary     Status: None   Collection Time: 02/17/18  9:28 PM  Result Value Ref Range   Glucose-Capillary 86 70 - 99 mg/dL  Glucose, capillary     Status: None   Collection Time: 02/18/18  6:31 AM  Result Value Ref Range   Glucose-Capillary 89 70 - 99 mg/dL  Glucose, capillary     Status: Abnormal   Collection Time: 02/18/18 12:12 PM  Result Value Ref Range   Glucose-Capillary 121 (H) 70 - 99 mg/dL  Glucose, capillary     Status: Abnormal   Collection Time: 02/18/18  4:33 PM  Result Value Ref Range   Glucose-Capillary 117 (H)  70 - 99 mg/dL  Glucose, capillary     Status: None   Collection Time: 02/18/18  9:51 PM  Result Value Ref Range   Glucose-Capillary 85 70 - 99 mg/dL   Comment 1 Notify RN   Glucose, capillary     Status: Abnormal   Collection Time: 02/19/18  7:55 AM  Result Value Ref Range   Glucose-Capillary 102 (H) 70 - 99 mg/dL   Comment 1 Notify RN      Constitutional: No distress . Vital signs reviewed. HENT: Normocephalic.  Atraumatic. Eyes: EOMI. No discharge. Cardiovascular: RRR. No JVD. Respiratory: CTA bilaterally. Normal effort. GI: BS +. Non-distended. Musc: No edema or tenderness in extremities. Skin:   Intact. Warm and dry. Neuro: Alert and oreinted Motor: 3/5 RUE/RLE 4/5 left deltoid, biceps, triceps, grip  Assessment/Plan: 1. Functional deficits secondary to small left subcortical infarct not noted on MRI which require 3+ hours per day of interdisciplinary therapy in a comprehensive inpatient rehab setting. Physiatrist is providing close team supervision and 24 hour management of active medical problems listed below. Physiatrist and rehab team continue to assess barriers to discharge/monitor patient progress toward functional and medical goals. FIM: Function - Bathing  Position: Shower Body parts bathed by patient: Right arm, Chest, Abdomen, Right upper leg, Left upper leg, Left lower leg, Right lower leg, Front perineal area, Buttocks Body parts bathed by helper: Left arm, Back Assist Level: (partial/moderate assist)  Function- Upper Body Dressing/Undressing What is the patient wearing?: Bra, Pull over shirt/dress Bra - Perfomed by patient: Thread/unthread right bra strap, Thread/unthread left bra strap Bra - Perfomed by helper: Hook/unhook bra (pull down sports bra) Pull over shirt/dress - Perfomed by patient: Thread/unthread left sleeve, Thread/unthread right sleeve, Put head through opening, Pull shirt over trunk Pull over shirt/dress - Perfomed by helper: Thread/unthread  right sleeve, Thread/unthread left sleeve(long sleeved shirt) Assist Level: (max assist) Function - Lower Body Dressing/Undressing What is the patient wearing?: Pants, Non-skid slipper socks, AFO, Shoes Position: Wheelchair/chair at sink Underwear - Performed by helper: Thread/unthread right underwear leg, Thread/unthread left underwear leg, Pull underwear up/down Pants- Performed by patient: Thread/unthread right pants leg, Thread/unthread left pants leg Pants- Performed by helper: Pull pants up/down Non-skid slipper socks- Performed by helper: Don/doff right sock, Don/doff left sock Shoes - Performed by patient: Don/doff left shoe, Fasten left, Fasten right Shoes - Performed by helper: Don/doff right shoe AFO - Performed by helper: Don/doff right AFO Assist for footwear: Partial/moderate assist Assist for lower body dressing: Touching or steadying assistance (Pt > 75%)  Function - Toileting Toileting steps completed by patient: Adjust clothing prior to toileting, Performs perineal hygiene Toileting steps completed by helper: Performs perineal hygiene, Adjust clothing after toileting Toileting Assistive Devices: Grab bar or rail Assist level: Touching or steadying assistance (Pt.75%)  Function - ArchivistToilet Transfers Toilet transfer assistive device: Grab bar, Walker, Elevated toilet seat/BSC over toilet Assist level to toilet: Touching or steadying assistance (Pt > 75%) Assist level from toilet: Touching or steadying assistance (Pt > 75%) Assist level to bedside commode (at bedside): Moderate assist (Pt 50 - 74%/lift or lower)(per Epimenio FootErin Neal, NT and Rona Ravensiera Craven, NT) Assist level from bedside commode (at bedside): Moderate assist (Pt 50 - 74%/lift or lower)  Function - Chair/bed transfer Chair/bed transfer method: Stand pivot Chair/bed transfer assist level: Touching or steadying assistance (Pt > 75%) Chair/bed transfer assistive device: Armrests, Walker Chair/bed transfer details: Verbal  cues for technique, Verbal cues for precautions/safety, Manual facilitation for placement  Function - Locomotion: Wheelchair Will patient use wheelchair at discharge?: No Type: Manual Max wheelchair distance: 150' Assist Level: Supervision or verbal cues Assist Level: Supervision or verbal cues Assist Level: Supervision or verbal cues Turns around,maneuvers to table,bed, and toilet,negotiates 3% grade,maneuvers on rugs and over doorsills: No Function - Locomotion: Ambulation Assistive device: Walker-rolling Max distance: 2650' Assist level: Touching or steadying assistance (Pt > 75%) Assist level: Touching or steadying assistance (Pt > 75%) Walk 50 feet with 2 turns activity did not occur: Safety/medical concerns Assist level: Touching or steadying assistance (Pt > 75%) Walk 150 feet activity did not occur: Safety/medical concerns Walk 10 feet on uneven surfaces activity did not occur: Safety/medical concerns  Function - Comprehension Comprehension: Auditory Comprehension assist level: Follows basic conversation/direction with no assist  Function - Expression Expression: Verbal Expression assist level: Expresses basic needs/ideas: With no assist  Function - Social Interaction Social Interaction assist level: Interacts appropriately with others with medication or extra time (anti-anxiety, antidepressant).  Function - Problem Solving Problem solving assist level: Solves basic 90% of the time/requires cueing < 10% of the time  Function - Memory Memory assist level: Recognizes or recalls 90% of the time/requires cueing < 10%  of the time Patient normally able to recall (first 3 days only): Current season, Location of own room, Staff names and faces, That he or she is in a hospital  Medical Problem List and Plan: 1.Functional deficits due to RUE>RLE weaknesssecondary to small subcortical infarct  Cont CIR, new plan is for SNF    2. DVT Prophylaxis/Anticoagulation:  Pharmaceutical:Lovenox 3. Pain Management:Tylenol prn for HA. Tramadol prn for moderate to severe pain. Kpad and sportscreme for neck 4. Mood:LCSW to follow for evaluation and and support 5. Neuropsych: This patientiscapable of making decisions onherown behalf. 6. Skin/Wound Care:Routine pressure relief measures 7. Fluids/Electrolytes/Nutrition:Monitor I's and O's.  8.HTN: Monitor: Blood pressures twice daily. Avoid hypotension to allow for adequate perfusion. Will continue Norvasc daily Vitals:   02/18/18 1959 02/19/18 0517  BP: 110/63 138/74  Pulse: 72 77  Resp: 19 16  Temp: 98.1 F (36.7 C) 98.3 F (36.8 C)  SpO2: 98% 98%   Controlled on 9/16 9.T2DM: We will monitor blood sugars before meals at bedtime. Resumed home dose Glucotrol. CBG (last 3)   Recent Labs    02/18/18 1633 02/18/18 2151 02/19/18 0755  GLUCAP 117* 85 102*  controlled on 9/16 10.Chronic low back pain: Resume Cymbalta. Question tramadol as needed for pain 11.Iron deficiency anemia:   Hemoglobin 10.3 on 9/4  Labs ordered for today 12.CKD stage IV: BUN/SCr 52/1.7 at admission.  Creat up to 2.05 on 9/13  Continue IVF daily at bedtime  Labs ordered for today  Has nephro f/u in [facility] in Nov may need to move up appt to Oct  LOS (Days) 13 A FACE TO FACE EVALUATION WAS PERFORMED  Erick Colace 02/19/2018, 8:33 AM

## 2018-02-19 NOTE — Progress Notes (Signed)
Physical Therapy Session Note  Patient Details  Name: Brandy Barnett MRN: [id number] Date of Birth: 03-Jul-1932  Today's Date: 02/19/2018 PT Individual Time: [id number] PT Individual Time Calculation (min): 58 min   Short Term Goals: Week 2:  PT Short Term Goal 1 (Week 2): STG=LTG due to ELOS  Skilled Therapeutic Interventions/Progress Updates:    Pt seated in w/c upon PT arrival, agreeable to therapy tx and denies pain. Pt transported to dayroom. Therapist assisted to don shoes and R AFO. Pt performed stand pivot from w/c<>nustep with CGA. Pt used nustep x 6 minutes on workload 5 for global strengthening. Pt transported to gym. Pt ambulated x 65 ft and 110 ft with RW and min assist this session, verbal cues for R heel strike. Pt performed exercises for hip abductor strengthening and neuro re-ed including 2 x 10 seated hip abduction with orange theraband, standing side steps in place, supine bridges and hip abduction in hooklying with orange theraband, verbal cues for techniques. Pt worked on standing balance while tossing horseshoes without UE support, min assist x 2 trials. Pt transported back to room and left seated in w/c with needs in reach.   Therapy Documentation Precautions:  Precautions Precautions: Fall Precaution Comments: pt reports several falls Restrictions Weight Bearing Restrictions: No   See Function Navigator for Current Functional Status.   Therapy/Group: Individual Therapy  Cresenciano GenreEmily van Schagen, PT, DPT 02/19/2018, 10:15 AM

## 2018-02-19 NOTE — Progress Notes (Signed)
Social Work Patient ID: Brandy CahillLu S Barnett, female   DOB: 26-Apr-1933, 84 y.o.   MRN: [id number]  Son coming in tomorrow for education still looking at options and [facility] needs and has a bed. [facility]

## 2018-02-19 NOTE — Progress Notes (Signed)
Occupational Therapy Session Note  Patient Details  Name: Lorenda CahillLu S Chismar MRN: [id number] Date of Birth: [birth year]  Today's Date: 02/19/2018 OT Individual Time: 1300-1355 OT Individual Time Calculation (min): 55 min    Short Term Goals: Week 2:  OT Short Term Goal 1 (Week 2): STGs = LTGs  Skilled Therapeutic Interventions/Progress Updates:    OT intervention with RUE AAROM, PROM, AROM, R hand finger flexion/extension, and activity tolerance to increase independence with BADLs. Pt report that she is not attempting to use her LUE during ADLs. Pt with weak grasp but able to pinch super soft theraputty.  Pt with weak finger extension but able to grasp cup, bring to mouth, and release cup. Pt with -3/5 strength with elbow flexion/extension.  Pt RUE AROM at shoulder approx 90 degrees. Pt with no report of pain.  Pt returned to room and remained in w/c with all needs within reach and chair alarm activated.   Therapy Documentation Precautions:  Precautions Precautions: Fall Precaution Comments: pt reports several falls Restrictions Weight Bearing Restrictions: No Pain: Pain Assessment Pain Scale: 0-10 Pain Score: 6  Pain Type: Acute pain Pain Location: Back RN aware and repositioned  See Function Navigator for Current Functional Status.   Therapy/Group: Individual Therapy  Rich BraveLanier, Cleora Karnik Chappell 02/19/2018, 2:58 PM

## 2018-02-19 NOTE — NC FL2 (Signed)
[place] MEDICAID FL2 LEVEL OF CARE SCREENING TOOL     IDENTIFICATION  Patient Name: Brandy CahillLu S Levit Birthdate: 19-Sep-1932 Sex: female Admission Date (Current Location): 02/06/2018  [facility] [place]   Facility and Address:  [facility], [address], [place], [place] [address]      Provider Number: [id number]  Attending Physician Name and Address:  Erick ColaceKirsteins, Andrew E, MD  Relative Name and Phone Number:  Billy-son [phone]    Current Level of Care: Other (Comment)(rehab) Recommended Level of Care: Skilled Nursing Facility Prior Approval Number:    Date Approved/Denied:   PASRR Number: [id number] [id number]  Discharge Plan: SNF    Current Diagnoses: Patient Active Problem List   Diagnosis Date Noted  . Diabetes mellitus type 2 in nonobese (HCC)   . Essential hypertension   . Hyperlipidemia 02/06/2018  . 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 Right hemiparesis (HCC)   . Benign essential HTN   . Chronic bilateral low back pain without sciatica   . Iron deficiency anemia   . Chronic kidney disease (CKD), stage IV (severe) (HCC)   . Acute ischemic stroke (HCC) s/p IV tPA 02/02/2018    Orientation RESPIRATION BLADDER Height & Weight     Self, Time, Situation, Place  Normal Continent Weight: 144 lb 6.4 oz (65.5 kg) Height:  5\' 2"  (157.5 cm)  BEHAVIORAL SYMPTOMS/MOOD NEUROLOGICAL BOWEL NUTRITION STATUS      Continent Diet(regular)  AMBULATORY STATUS COMMUNICATION OF NEEDS Skin   Supervision Verbally Normal                       Personal Care Assistance Level of Assistance  Bathing, Dressing Bathing Assistance: Limited assistance   Dressing Assistance: Limited assistance     Functional Limitations Info  Sight Sight Info: Impaired        SPECIAL CARE FACTORS FREQUENCY  PT (By licensed PT), OT (By licensed OT)     PT Frequency:  5x week OT Frequency: 5x week            Contractures Contractures Info: Not present    Additional Factors Info  Code Status, Allergies Code Status Info: Full Allergies Info: NKDA           Current Medications (02/19/2018):  This is the current hospital active medication list Current Facility-Administered Medications  Medication Dose Route Frequency Provider Last Rate Last Dose  . 0.45 % sodium chloride infusion   Intravenous Continuous Erick ColaceKirsteins, Andrew E, MD 75 mL/hr at 02/18/18 1925    . acetaminophen (TYLENOL) tablet 325-650 mg  325-650 mg Oral Q4H PRN Jacquelynn CreeLove, Pamela S, PA-C   650 mg at 02/12/18 2112  . albuterol (PROVENTIL) (2.5 MG/3ML) 0.083% nebulizer solution 3 mL  3 mL Inhalation Q6H PRN Love, Pamela S, PA-C      . alum & mag hydroxide-simeth (MAALOX/MYLANTA) 200-200-20 MG/5ML suspension 30 mL  30 mL Oral Q4H PRN Love, Pamela S, PA-C      . amLODipine (NORVASC) tablet 10 mg  10 mg Oral Daily Jacquelynn CreeLove, Pamela S, PA-C   10 mg at 02/19/18 [id number]  . aspirin tablet 325 mg  325 mg Oral Daily Jacquelynn CreeLove, Pamela S, PA-C   325 mg at 02/19/18 [id number]  . bisacodyl (DULCOLAX) suppository 10 mg  10 mg Rectal Daily PRN Jacquelynn CreeLove, Pamela S, PA-C   10 mg at 02/17/18 1800  . diphenhydrAMINE (  BENADRYL) 12.5 MG/5ML elixir 12.5-25 mg  12.5-25 mg Oral Q6H PRN Love, Pamela S, PA-C      . enoxaparin (LOVENOX) injection 30 mg  30 mg Subcutaneous Q24H Kirsteins, Victorino Sparrow, MD   30 mg at 02/18/18 1645  . glipiZIDE (GLUCOTROL XL) 24 hr tablet 2.5 mg  2.5 mg Oral Daily Jacquelynn Cree, PA-C   2.5 mg at 02/19/18 4098  . guaiFENesin-dextromethorphan (ROBITUSSIN DM) 100-10 MG/5ML syrup 5-10 mL  5-10 mL Oral Q6H PRN Love, Pamela S, PA-C      . insulin aspart (novoLOG) injection 0-5 Units  0-5 Units Subcutaneous QHS Love, Pamela S, PA-C      . insulin aspart (novoLOG) injection 0-9 Units  0-9 Units Subcutaneous TID WC LoveEvlyn Kanner, PA-C   1 Units at 02/18/18 1229  . multivitamin (PROSIGHT) tablet 1 tablet  1 tablet Oral BID  Legrand Pitts, [facility]   1 tablet at 02/19/18 1191  . MUSCLE RUB CREA   Topical BID PRN Kirsteins, Victorino Sparrow, MD      . polyethylene glycol (MIRALAX / GLYCOLAX) packet 17 g  17 g Oral Daily PRN Jacquelynn Cree, PA-C   17 g at 02/16/18 1732  . prochlorperazine (COMPAZINE) tablet 5-10 mg  5-10 mg Oral Q6H PRN Love, Pamela S, PA-C       Or  . prochlorperazine (COMPAZINE) injection 5-10 mg  5-10 mg Intramuscular Q6H PRN Love, Pamela S, PA-C       Or  . prochlorperazine (COMPAZINE) suppository 12.5 mg  12.5 mg Rectal Q6H PRN Love, Pamela S, PA-C      . rosuvastatin (CRESTOR) tablet 10 mg  10 mg Oral q1800 Jacquelynn Cree, PA-C   10 mg at 02/18/18 1749  . senna-docusate (Senokot-S) tablet 1 tablet  1 tablet Oral QHS PRN Jacquelynn Cree, PA-C   1 tablet at 02/12/18 2112  . sevelamer carbonate (RENVELA) tablet 800 mg  800 mg Oral Q lunch Jacquelynn Cree, PA-C   800 mg at 02/18/18 1229  . sodium phosphate (FLEET) 7-19 GM/118ML enema 1 enema  1 enema Rectal Once PRN Love, Pamela S, PA-C      . traMADol Janean Sark) tablet 50 mg  50 mg Oral Daily PRN Jacquelynn Cree, PA-C   50 mg at 02/16/18 0807  . traZODone (DESYREL) tablet 25-50 mg  25-50 mg Oral QHS PRN Jacquelynn Cree, PA-C   50 mg at 02/18/18 1922  . vitamin C (ASCORBIC ACID) tablet 1,000 mg  1,000 mg Oral Daily Love, Evlyn Kanner, PA-C   1,000 mg at 02/19/18 4782     Discharge Medications: Please see discharge summary for a list of discharge medications.  Relevant Imaging Results:  Relevant Lab Results:   Additional Information SSN: [id number]  Gean Laursen, Lemar Livings, LCSW

## 2018-02-19 NOTE — Progress Notes (Signed)
Occupational Therapy Session Note  Patient Details  Name: Brandy Barnett MRN: [id number] Date of Birth: 1932-10-16  Today's Date: 02/19/2018 OT Individual Time: 1050-1120 OT Individual Time Calculation (min): 30 min    Short Term Goals: Week 1:  OT Short Term Goal 1 (Week 1): Pt will be able to complete stand pivot to toilet with steadying A. OT Short Term Goal 1 - Progress (Week 1): Met OT Short Term Goal 2 (Week 1): Pt will be able to toilet with steadying A. OT Short Term Goal 2 - Progress (Week 1): Met OT Short Term Goal 3 (Week 1): Pt will be able to bathe with steadying A and use of AE as needed. OT Short Term Goal 3 - Progress (Week 1): Progressing toward goal OT Short Term Goal 4 (Week 1): Pt will be able to don pullover shirt with min A. OT Short Term Goal 4 - Progress (Week 1): Met OT Short Term Goal 5 (Week 1): Pt will be able to stand without UE support with min A to enable her to use her L hand to pull her pants up. OT Short Term Goal 5 - Progress (Week 1): Met Week 2:  OT Short Term Goal 1 (Week 2): STGs = LTGs      Skilled Therapeutic Interventions/Progress Updates:    Pt received in w/c in room. She stated she was already dressed for the day and ready for therapy for her R arm. Pt taken to gym and worked on sit to stand at hi/low table and worked on active pinch/grasp and release moving checkers (with difficulty) and with 1 inch blocks with minimal difficulty with A proximally to bring arm into sh flexion reach.  Pt tolerated standing for only about a minute or 2 at a time for 3 times.  She said her low back was bothering her and preferred to work from sitting.  Discussed d/c plans and the possibility of her going to an ALF.  Pt really needs to be mod I with toileting.  She has been using hospital briefs which make toieting skills difficult. Obtained a pair of pull up briefs for pt to practice with in tomorrows session.  Pt taken back to room and chair alarm on. Pt with all needs  met.  Therapy Documentation Precautions:  Precautions Precautions: Fall Precaution Comments: pt reports several falls Restrictions Weight Bearing Restrictions: No  Vital Signs: Therapy Vitals Temp: 98.3 F (36.8 C) Temp Source: Oral Pulse Rate: 77 Resp: 16 BP: 138/74 Patient Position (if appropriate): Lying Oxygen Therapy SpO2: 98 % O2 Device: Room Air Pain: Pain Assessment Pain Scale: 0-10 Pain Score: 0-No pain ADL: ADL ADL Comments: refer to functional navigator  See Function Navigator for Current Functional Status.   Therapy/Group: Individual Therapy  SAGUIER,JULIA 02/19/2018, 8:58 AM

## 2018-02-19 NOTE — Plan of Care (Signed)
  Problem: Consults Goal: RH STROKE PATIENT EDUCATION Description See Patient Education module for education specifics  Outcome: Progressing Goal: Nutrition Consult-if indicated Outcome: Progressing Goal: Diabetes Guidelines if Diabetic/Glucose > 140 Description If diabetic or lab glucose is > 140 mg/dl - Initiate Diabetes/Hyperglycemia Guidelines & Document Interventions  Outcome: Progressing   Problem: RH BOWEL ELIMINATION Goal: RH STG MANAGE BOWEL WITH ASSISTANCE Description STG Manage Bowel with  Min Assistance.  Outcome: Progressing   Problem: RH BLADDER ELIMINATION Goal: RH STG MANAGE BLADDER WITH ASSISTANCE Description STG Manage Bladder With min Assistance  Outcome: Progressing   Problem: RH SAFETY Goal: RH STG ADHERE TO SAFETY PRECAUTIONS W/ASSISTANCE/DEVICE Description STG Adhere to Safety Precautions With  Min Assistance/Device.  Outcome: Progressing   Problem: RH COGNITION-NURSING Goal: RH STG USES MEMORY AIDS/STRATEGIES W/ASSIST TO PROBLEM SOLVE Description STG Uses Memory Aids/Strategies With mod I Assistance to Problem Solve.  Outcome: Progressing   Problem: RH KNOWLEDGE DEFICIT Goal: RH STG INCREASE KNOWLEDGE OF DIABETES Description Pt will be able to explain management of DM with medications and diet using cues/reminders Outcome: Progressing Goal: RH STG INCREASE KNOWLEDGE OF HYPERTENSION Description Pt will be able to explain management of HTn using medications and diet with cues/reminders Outcome: Progressing Goal: RH STG INCREASE KNOWLEDGE OF STROKE PROPHYLAXIS Description Pt will be able to explain stroke prevention and medications using cues\/reminders Outcome: Progressing

## 2018-02-19 NOTE — Discharge Summary (Signed)
Physician Discharge Summary  Patient ID: Brandy Barnett MRN: [id number] DOB/AGE: [birth year] 84 y.o.  Admit date: 02/06/2018 Discharge date: 02/21/2018  Discharge Diagnoses:  Principal Problem:   Acute ischemic stroke [facility]) s/p IV tPA Active Problems:   Chronic bilateral low back pain without sciatica   Iron deficiency anemia   Chronic kidney disease (CKD), stage IV (severe) (HCC)   Essential hypertension   Diabetes mellitus type 2 in nonobese [facility])   UTI (urinary tract infection)   Discharged Condition: stable   Significant Diagnostic Studies: Mr Brain 82 Contrast  Result Date: 02/03/2018 CLINICAL DATA:  Follow up stroke, status post tPA. History of hypertension and diabetes. EXAM: MRI HEAD WITHOUT CONTRAST TECHNIQUE: Multiplanar, multiecho pulse sequences of the brain and surrounding structures were obtained without intravenous contrast. COMPARISON:  CT HEAD February 02, 2018. FINDINGS: INTRACRANIAL CONTENTS: No reduced diffusion to suggest acute ischemia. No susceptibility artifact to suggest hemorrhage. Moderate parenchymal brain volume loss. No hydrocephalus. Symmetric basal ganglia mineralization. Patchy supratentorial white matter FLAIR T2 hyperintensities. Abnormal atrophy and symmetric T2 bright signal within the pyramids and inferior cerebellar peduncles. No suspicious parenchymal signal, masses, mass effect. No abnormal extra-axial fluid collections. No extra-axial masses. VASCULAR: Normal major intracranial vascular flow voids present at skull base. SKULL AND UPPER CERVICAL SPINE: No abnormal sellar expansion. No suspicious calvarial bone marrow signal. Craniocervical junction maintained. SINUSES/ORBITS: The mastoid air-cells and included paranasal sinuses are well-aerated.The included ocular globes and orbital contents are non-suspicious. Status post bilateral ocular lens implants. OTHER: None. IMPRESSION: 1. No acute intracranial process. 2. Advanced brainstem atrophy and basal ganglia  mineralization, constellation of findings seen with multi-system atrophy or other neuro degenerative syndromes. 3. Mild-to-moderate chronic small vessel ischemic changes. Electronically Signed   By: Awilda Metro M.D.   On: 02/03/2018 22:32   Ct C-spine No Charge  Result Date: 02/02/2018 CLINICAL DATA:  Unwitnessed syncopal episode. EXAM: CT CERVICAL SPINE WITHOUT CONTRAST TECHNIQUE: Multidetector CT imaging of the cervical spine was performed without intravenous contrast. Multiplanar CT image reconstructions were also generated. COMPARISON:  None. FINDINGS: Alignment: Normal Skull base and vertebrae: There is no acute fracture. Soft tissues and spinal canal: No prevertebral collection. Paraspinous muscles are normal. Disc levels: There is multilevel moderate-to-severe facet hypertrophy with fusion of the right C5-6 facets. Disc space narrowing is greatest at C5-6. Upper chest: Visualized lung apices are clear. Other: None IMPRESSION: 1. No acute fracture or static subluxation of the cervical spine. 2. Severe multilevel facet arthrosis. Electronically Signed   By: Deatra Robinson M.D.   On: 02/02/2018 21:51   Dg Chest Port 1 View  Result Date: 02/02/2018 CLINICAL DATA:  Stroke. EXAM: PORTABLE CHEST 1 VIEW COMPARISON:  Chest radiograph December 12, 2010 FINDINGS: Cardiac silhouette is mildly enlarged. Moderate hiatal hernia. Tortuous calcified aorta. Mild chronic interstitial changes without pleural effusion or focal consolidation. No pneumothorax. Osteopenia. IMPRESSION: 1. Cardiomegaly and chronic interstitial changes. 2. Moderate hiatal hernia. 3.  Aortic Atherosclerosis (ICD10-I70.0). Electronically Signed   By: Awilda Metro M.D.   On: 02/02/2018 23:19   Dg Finger Middle Right  Result Date: 02/06/2018 CLINICAL DATA:  Recent fall.  Pain. EXAM: RIGHT MIDDLE FINGER 2+V COMPARISON:  No recent prior. FINDINGS: Diffuse severe degenerative change. No acute bony abnormality identified. No evidence of  fracture. IMPRESSION: Diffuse severe degenerative change.  No acute abnormality. Electronically Signed   By: Maisie Fus  Register   On: 02/06/2018 16:14    Labs:  Basic Metabolic Panel: Recent Labs  Lab 02/16/18 4098 02/19/18  1305 02/21/18 0529  NA 142 144 142  K 4.2 4.1 4.2  CL 110 112* 112*  CO2 23 17* 19*  GLUCOSE 103* 179* 100*  BUN 51* 61* 51*  CREATININE 2.05* 2.04* 1.75*  CALCIUM 9.0 9.3 8.6*    CBC: CBC Latest Ref Rng & Units 02/21/2018 02/19/2018 02/14/2018  WBC 4.0 - 10.5 K/uL 9.1 11.7(H) 9.7  Hemoglobin 12.0 - 15.0 g/dL 1.6(X) 11.1(L) 10.3(L)  Hematocrit 36.0 - 46.0 % 29.8(L) 34.0(L) 31.9(L)  Platelets 150 - 400 K/uL 238 276 213    CBG: Recent Labs  Lab 02/20/18 0647 02/20/18 1153 02/20/18 1638 02/20/18 2112 02/21/18 0636  GLUCAP 88 115* 107* 126* 98    Brief HPI:   Brandy Barnett is an 84 year old right-handed female with history of T2DM, CKD--baseline 1.8?, HTN, anemia; who was admitted on 01/23/2018 with transient loss of consciousness with right-sided weakness, fall and inability to walk.  CT of head was negative and TPA administered.  MRI brain negative for acute intracranial process and MRA brain showed mild to moderate chronic small vessel changes with advanced brainstem atrophy.  Dr. Pearlean Brownie felt that patient with small subcortical stroke not seen on MRI and patient to continue on aspirin.  Therapy evaluations done revealing weakness with decrease in coordination affecting overall mobility and ADLs.  CIR was recommended due to functional decline.   Hospital Course: Brandy Barnett was admitted to rehab 02/06/2018 for inpatient therapies to consist of PT and OT at least three hours five days a week. Past admission physiatrist, therapy team and rehab RN have worked together to provide customized collaborative inpatient rehab. She was maintained on ASA for secondary stroke prevention. She did report fall prior to admission and significant pain right middle finger. Finger noted  to be edematous and tender on exam therefore Xrays done revealing diffuse degenerative changes without fracture. This has been treated locally with improvement.  Blood pressures are monitored on twice daily basis and have been controlled off of medications. Diabetes has been monitored with AC/HS cbg checks  Home dose Glucotrol was resumed with improvement in BS control.   IVF were discontinued after admission and renal status has been monitored with serial checks. This revealed evidence of acute on chronic renal failure and IVF were added at night to help with hydration.  CBC showed rise in white count and family reported history of frequent UTI. Patient also reported urgency with incontinence therefore UA/UCS done showing evidence of UTI. She was started on Keflex empirically on 9/18 with improvement in renal status. Urine culture showed  >100,000 gram negative rods and  is currently pending. Recommend monitoring renal status serially for recovery.  Iron deficiency anemia has been monitored and H&H showed drop today but no signs of bleeding noted. Would recommended close monitoring for stability and follow up with nephrology for input.   She is making steady progress but goals downgraded due to need for min assist. Family is unable to provide care needed and has elected on SNF for progressive therapy.    Rehab course: During patient's stay in rehab weekly team conferences were held to monitor patient's progress, set goals and discuss barriers to discharge. At admission, patient required mod assist with mobility and max assist with basic self-care task. She  has had improvement in activity tolerance, balance, postural control as well as ability to compensate for deficits. She is able to complete ADL tasks with min assist. She requires supervision with cues for transfers and to ambulate 150'  with CGA. Family education was completed with son regarding care needed.    Disposition: Skilled Nursing  Facility  Diet: Heart Healthy.   Special Instructions: 1. Encourage fluid intake. Follow up with nephrology in 2-3 weeks.    2.  Repeat BMET/CBC in 2-3 days.   Discharge Instructions    Ambulatory referral to Physical Medicine Rehab   Complete by:  As directed    4-6 weeks follow up appointment     Allergies as of 02/21/2018   No Known Allergies     Medication List    STOP taking these medications   DULoxetine 30 MG capsule Commonly known as:  CYMBALTA     TAKE these medications   acetaminophen 325 MG tablet Commonly known as:  TYLENOL Take 1-2 tablets (325-650 mg total) by mouth every 4 (four) hours as needed for mild pain. What changed:    medication strength  how much to take  when to take this  reasons to take this   albuterol 108 (90 Base) MCG/ACT inhaler Commonly known as:  PROVENTIL HFA;VENTOLIN HFA Inhale 2 puffs into the lungs every 6 (six) hours as needed for wheezing or shortness of breath.   amLODipine 5 MG tablet Commonly known as:  NORVASC Take 1 tablet (5 mg total) by mouth daily. What changed:    medication strength  how much to take   aspirin 325 MG tablet Take 1 tablet (325 mg total) by mouth daily.   cephALEXin 250 MG capsule Commonly known as:  KEFLEX Take 1 capsule (250 mg total) by mouth every 12 (twelve) hours.   glipiZIDE 2.5 MG 24 hr tablet Commonly known as:  GLUCOTROL XL Take 2.5 mg by mouth daily.   MUSCLE RUB 10-15 % Crea Apply 1 application topically 2 (two) times daily. To neck   PRESERVISION AREDS 2 Caps Take 1 capsule by mouth 2 (two) times daily.   rosuvastatin 10 MG tablet Commonly known as:  CRESTOR Take 10 mg by mouth every evening.   senna-docusate 8.6-50 MG tablet Commonly known as:  Senokot-S Take 1 tablet by mouth at bedtime as needed for mild constipation.   sevelamer carbonate 800 MG tablet Commonly known as:  RENVELA Take 800 mg by mouth daily with lunch.   traMADol 50 MG tablet Commonly known  as:  ULTRAM Take 1 tablet (50 mg total) by mouth daily as needed (pain).   vitamin C 1000 MG tablet Take 1,000 mg by mouth daily.       Contact information for follow-up providers    Velazquez, Vira Browns., MD Follow up.   Specialty:  Internal Medicine Contact information: [address] [place] [address] [phone]        Erick Colace, MD Follow up.   Specialty:  Physical Medicine and Rehabilitation Why:  Office will call with follow-up appointment Contact information: [address] [place] [address] [phone]        [facility]. Call.   Specialty:  Neurology Why:  in 1 to 2 days for follow-up appointment Contact information: [address] [place] [address] [phone]           Contact information for after-discharge care    Destination    [facility] .   Service:  Skilled Nursing Contact information: [address] [place] [address] [phone]                  Signed:  Jacquelynn Creeamela S Saharah Sherrow 02/21/2018, 11:36 AM

## 2018-02-20 ENCOUNTER — Inpatient Hospital Stay (HOSPITAL_COMMUNITY): Payer: BLUE CROSS/BLUE SHIELD

## 2018-02-20 ENCOUNTER — Inpatient Hospital Stay (HOSPITAL_COMMUNITY): Payer: BLUE CROSS/BLUE SHIELD | Admitting: Occupational Therapy

## 2018-02-20 ENCOUNTER — Inpatient Hospital Stay (HOSPITAL_COMMUNITY): Payer: BLUE CROSS/BLUE SHIELD | Admitting: Physical Therapy

## 2018-02-20 DIAGNOSIS — D72828 Other elevated white blood cell count: Secondary | ICD-10-CM

## 2018-02-20 LAB — URINALYSIS, ROUTINE W REFLEX MICROSCOPIC
Bilirubin Urine: NEGATIVE
Glucose, UA: NEGATIVE mg/dL
HGB URINE DIPSTICK: NEGATIVE
Ketones, ur: NEGATIVE mg/dL
Nitrite: POSITIVE — AB
PROTEIN: 30 mg/dL — AB
Specific Gravity, Urine: 1.012 (ref 1.005–1.030)
pH: 5 (ref 5.0–8.0)

## 2018-02-20 LAB — GLUCOSE, CAPILLARY
GLUCOSE-CAPILLARY: 88 mg/dL (ref 70–99)
GLUCOSE-CAPILLARY: 115 mg/dL — AB (ref 70–99)
GLUCOSE-CAPILLARY: 107 mg/dL — AB (ref 70–99)
Glucose-Capillary: 126 mg/dL — ABNORMAL HIGH (ref 70–99)

## 2018-02-20 MED ORDER — SENNOSIDES-DOCUSATE SODIUM 8.6-50 MG PO TABS: 1.0000 | ORAL_TABLET | Freq: Every evening | ORAL | Status: DC | PRN

## 2018-02-20 MED ORDER — AMLODIPINE BESYLATE 5 MG PO TABS: 5.0000 mg | ORAL_TABLET | Freq: Every day | ORAL | Status: DC

## 2018-02-20 MED ORDER — CEPHALEXIN 250 MG PO CAPS
250.0000 mg | ORAL_CAPSULE | Freq: Two times a day (BID) | ORAL | Status: DC
  Administered 2018-02-20 – 2018-02-21 (×2): 250 mg via ORAL
  Filled 2018-02-20 (×2): qty 1

## 2018-02-20 MED ORDER — ACETAMINOPHEN 325 MG PO TABS: 325.0000 mg | ORAL_TABLET | ORAL | Status: DC | PRN

## 2018-02-20 MED ORDER — MUSCLE RUB 10-15 % EX CREA: 1.0000 "application " | TOPICAL_CREAM | Freq: Two times a day (BID) | CUTANEOUS | 0 refills | Status: DC

## 2018-02-20 NOTE — Progress Notes (Signed)
Recreational Therapy Session Note  Patient Details  Name: Brandy Barnett MRN: [id number] Date of Birth: Aug 29, 1932 Today's Date: 02/20/2018 Time:  1435-1450 Pain: no c/o Skilled Therapeutic Interventions/Progress Updates: Informed through team that pt is discharging to SNF tomorrow for continued therapies and 24 hour supervision/assistance.  Education focused on importance of staying active, leisure education.  Pt stated understanding and appreciation of information. Brandy Barnett 02/20/2018, 4:12 PM

## 2018-02-20 NOTE — Progress Notes (Signed)
PHARMACY NOTE:  ANTIMICROBIAL RENAL DOSAGE ADJUSTMENT  Current antimicrobial regimen includes a mismatch between antimicrobial dosage and estimated renal function.  As per policy approved by the Pharmacy & Therapeutics and Medical Executive Committees, the antimicrobial dosage will be adjusted accordingly.  Current antimicrobial dosage:  Cephalexin 500 mg PO q8h  Indication: UTI  Renal Function:  Estimated Creatinine Clearance: 18.6 mL/min (A) (by C-G formula based on SCr of 2.04 mg/dL (H)). []      On intermittent HD, scheduled: []      On CRRT    Antimicrobial dosage has been changed to:  Cephalexin 250 mg PO q12h  Thank you for allowing pharmacy to be a part of this patient's care.  Toys 'R' UsKimberly Tanyla Stege, Pharm.D., BCPS Clinical Pharmacist  **Pharmacist phone directory can now be found on [email or web address] ([email or web address]).  Listed under [facility].  02/20/2018 5:50 PM

## 2018-02-20 NOTE — Progress Notes (Signed)
Physical Therapy Session Note  Patient Details  Name: Brandy Barnett MRN: [id number] Date of Birth: 11/24/1932  Today's Date: 02/20/2018 PT Individual Time: 1000-1055 PT Individual Time Calculation (min): 55 min   Short Term Goals: Week 2:  PT Short Term Goal 1 (Week 2): STG=LTG due to ELOS  Skilled Therapeutic Interventions/Progress Updates:   Pt in w/c and agreeable to therapy, denies pain. Son present for family education this session. Educated son on pt's CLOF including use of assistive devices including w/c for energy conservation, RW, and RAFO. Educated him on providing close supervision to min guard assist w/ gait, negotiating stairs, and during car transfer. Discussed adjusting home environment to make it more accessible including adding a rail to garage steps, moving furniture out of the way, and making patient needs reachable. Educated him on verbal cues pt frequently needs including increasing R foot clearance during gait and for safety awareness. Per pt and pt's son, pt likely to go to assisted living for a few weeks prior to d/c home. Provided all education based on pt's CLOF and what level of assist is recommended at d/c, in addition to discussing assist she will likely still need in the home environment in a few weeks. Returned to room and ended session in supine, call bell within reach and all needs met.   Therapy Documentation Precautions:  Precautions Precautions: Fall Precaution Comments: pt reports several falls Restrictions Weight Bearing Restrictions: No Pain: Pain Assessment Pain Scale: 0-10 Pain Score: 0-No pain PAINAD (Pain Assessment in Advanced Dementia) Breathing: normal  See Function Navigator for Current Functional Status.   Therapy/Group: Individual Therapy  [name] 02/20/2018, 12:44 PM

## 2018-02-20 NOTE — Progress Notes (Signed)
Physical Therapy Session Note  Patient Details  Name: Brandy Barnett S Sawaya MRN: [id number] Date of Birth: [birth year]  Today's Date: 02/20/2018 PT Individual Time: 1302-1410 PT Individual Time Calculation (min): 68 min   Short Term Goals: Week 2:  PT Short Term Goal 1 (Week 2): STG=LTG due to ELOS  Skilled Therapeutic Interventions/Progress Updates:    Pt seated in w/c upon PT arrival, agreeable to therapy tx and denies pain. Therapist donned shoes and L AFO. Pt transported to the gym. Pt ambulated x 80 ft with RW and min assist, verbal cues for R foot clearance. Pt ambulated into hallway x 40 ft with RW and min assist, worked on sidestepping with B UE support on rail 2 x 20 ft for hip abductor strengthening. Pt ambulated to the parallel bars, within parallel bars pt performed standing LE strengthening exercises including 2 x 10 each, hip abduction, hip extension and mini squats.  Pt ambulated gym<> rehab apartment x100 ft each direction and transferred on/off furniture. Pt performed bed mobility on rehab apartment bed Mod I. Pt transported back to room and performed stand pivot to bed with supervision using bedrails. Pt transferred to supine Mod I and left with needs in reach and bed alarm set.   Therapy Documentation Precautions:  Precautions Precautions: Fall Precaution Comments: pt reports several falls Restrictions Weight Bearing Restrictions: No   See Function Navigator for Current Functional Status.   Therapy/Group: Individual Therapy  Cresenciano GenreEmily van Schagen, PT, DPT 02/20/2018, 1:18 PM

## 2018-02-20 NOTE — Progress Notes (Signed)
Recreational Therapy Discharge Summary Patient Details  Name: Brandy CahillLu S Barnett MRN: [id number] Date of Birth: 07/02/1932 Today's Date: 02/20/2018  Comments on progress toward goals: Pt has made good progress toward therapy goals and is [facility] and 24 hour assistance.  Pt is discharging at overall Min assist level.  TR education provided on the importance of staying active and leisure education.  Pt stated understanding.  Reasons for discharge: discharge from hospital  Follow-up: Encourage particpated in activities programs as available  Patient/family agrees with progress made and goals achieved: Yes  Sultana Tierney 02/20/2018, 4:15 PM

## 2018-02-20 NOTE — Progress Notes (Signed)
Social Work Patient ID: Lorenda CahillLu S Patalano, female   DOB: 04-10-1933, 84 y.o.   MRN: [id number]   [facility].  Pt and son have accepted bed.  Plan for d/c in the morning.  Fardowsa Authier, LCSW

## 2018-02-20 NOTE — Progress Notes (Signed)
Physical Therapy Discharge Summary  Patient Details  Name: Brandy Barnett MRN: [id number] Date of Birth: 06-Aug-1932  Patient has met 4 of 7 long term goals due to improved activity tolerance, improved balance, improved postural control, increased strength, ability to compensate for deficits and improved coordination.  Patient to discharge at an ambulatory level [place].   Patient's care partner is independent to provide the necessary physical assistance at discharge. Pt's son has been educated on providing min guard to close supervision for all functional mobility. Pt and family have decided to d/c to assisted living facility for pt to continue working on independence prior to d/c to home.   Reasons goals not met: Pt continues to require manual assist w/ negotiating steps, when ambulating community distances as RLE quickly fatigues affecting safety, and w/ functional tasks requiring dynamic balance.  Recommendation:  Patient will benefit from ongoing skilled PT services in skilled nursing facility setting to continue to advance safe functional mobility, address ongoing impairments in RLE strength and coordination, functional balance, and endurance, and minimize fall risk.  Equipment: No equipment provided  Reasons for discharge: treatment goals met and discharge from hospital  Patient/family agrees with progress made and goals achieved: Yes  PT Discharge Precautions/Restrictions Precautions Precautions: Fall Restrictions Weight Bearing Restrictions: No Pain Pain Assessment Pain Scale: 0-10 Pain Score: 0-No pain PAINAD (Pain Assessment in Advanced Dementia) Breathing: normal Vision/Perception  Perception Perception: Within Functional Limits Praxis Praxis: Intact  Cognition Overall Cognitive Status: Within Functional Limits for tasks assessed Arousal/Alertness: Awake/alert Orientation Level: Oriented X4 Attention: Focused;Sustained Focused Attention: Appears intact Sustained  Attention: Appears intact Memory: Appears intact Awareness: Appears intact Safety/Judgment: Appears intact Sensation Sensation Light Touch: Appears Intact Hot/Cold: Appears Intact Proprioception: Appears Intact Additional Comments: sensation grossly intact, pt does report some numbness/tingling in R foot Coordination Gross Motor Movements are Fluid and Coordinated: No Fine Motor Movements are Fluid and Coordinated: No Coordination and Movement Description: R sided weakness Heel Shin Test: impaired R LE Motor  Motor Motor: Hemiplegia Motor - Skilled Clinical Observations: mild R hemiparesis  Mobility Bed Mobility Bed Mobility: Supine to Sit;Sit to Supine Supine to Sit: Independent with assistive device Sit to Supine: Independent with assistive device Transfers Transfers: Sit to Stand;Stand to Sit;Stand Pivot Transfers Sit to Stand: Independent with assistive device Stand to Sit: Supervision/Verbal cueing Stand Pivot Transfers: Supervision/Verbal cueing Transfer (Assistive device): Rolling walker Locomotion  Gait Ambulation: Yes Gait Assistance: Contact Guard/Touching assist Gait Distance (Feet): 150 Feet Assistive device: Rolling walker  Trunk/Postural Assessment  Cervical Assessment Cervical Assessment: Exceptions to WFL(forward head posture) Thoracic Assessment Thoracic Assessment: Exceptions to WFL(rounded shoulders) Lumbar Assessment Lumbar Assessment: Within Functional Limits Postural Control Postural Control: Within Functional Limits  Balance Balance Balance Assessed: Yes Static Sitting Balance Static Sitting - Level of Assistance: 6: Modified independent (Device/Increase time) Dynamic Sitting Balance Dynamic Sitting - Level of Assistance: 6: Modified independent (Device/Increase time) Static Standing Balance Static Standing - Level of Assistance: 6: Modified independent (Device/Increase time) Dynamic Standing Balance Dynamic Standing - Level of Assistance:  5: Stand by assistance;4: Min assist Extremity Assessment  RUE Assessment Passive Range of Motion (PROM) Comments: WFL Active Range of Motion (AROM) Comments: shoulder flexion 30, elb flex 120, finger flexion and extension 75% of full ROM  General Strength Comments: elbow flex/ext 3-/5, grasp 3-/5 LUE Assessment LUE Assessment: Within Functional Limits RLE Assessment RLE Assessment: Exceptions to [facility] Passive Range of Motion (PROM) Comments: DF to neutral Active Range of Motion (AROM) Comments: limited secondary  to weakness RLE Strength Right Hip Extension: 3+/5 Right Knee Flexion: 4/5 Right Knee Extension: 4/5 Right Ankle Dorsiflexion: 4/5 Right Ankle Plantar Flexion: 3+/5 LLE Assessment LLE Assessment: Within Functional Limits   See Function Navigator for Current Functional Status.  Brandy Barnett, PT, DPT 02/20/2018, 1:39 PM   Brandy Barnett, PT, DPT

## 2018-02-20 NOTE — Progress Notes (Signed)
Subjective/Complaints:  Per OT, pt was wet and did not realize it this am.  Per son pt prone to UTI.  Discuseed elevated WBC, no flank pain no sweats or chills, no dysuria     Review of systems: Denies CP, SOB, N/V/D  Objective: Vital Signs: Blood pressure (!) 145/74, pulse 76, temperature 97.7 F (36.5 C), resp. rate 16, height '5\' 2"'$  (1.575 m), weight 65.5 kg, SpO2 98 %. No results found. Results for orders placed or performed during the hospital encounter of 02/06/18 (from the past 72 hour(s))  Glucose, capillary     Status: Abnormal   Collection Time: 02/17/18 11:58 AM  Result Value Ref Range   Glucose-Capillary 100 (H) 70 - 99 mg/dL  Glucose, capillary     Status: Abnormal   Collection Time: 02/17/18  4:37 PM  Result Value Ref Range   Glucose-Capillary 153 (H) 70 - 99 mg/dL  Glucose, capillary     Status: None   Collection Time: 02/17/18  9:28 PM  Result Value Ref Range   Glucose-Capillary 86 70 - 99 mg/dL  Glucose, capillary     Status: None   Collection Time: 02/18/18  6:31 AM  Result Value Ref Range   Glucose-Capillary 89 70 - 99 mg/dL  Glucose, capillary     Status: Abnormal   Collection Time: 02/18/18 12:12 PM  Result Value Ref Range   Glucose-Capillary 121 (H) 70 - 99 mg/dL  Glucose, capillary     Status: Abnormal   Collection Time: 02/18/18  4:33 PM  Result Value Ref Range   Glucose-Capillary 117 (H) 70 - 99 mg/dL  Glucose, capillary     Status: None   Collection Time: 02/18/18  9:51 PM  Result Value Ref Range   Glucose-Capillary 85 70 - 99 mg/dL   Comment 1 Notify RN   Glucose, capillary     Status: None   Collection Time: 02/19/18  6:26 AM  Result Value Ref Range   Glucose-Capillary 98 70 - 99 mg/dL   Comment 1 Notify RN   Glucose, capillary     Status: Abnormal   Collection Time: 02/19/18  7:55 AM  Result Value Ref Range   Glucose-Capillary 102 (H) 70 - 99 mg/dL   Comment 1 Notify RN   Glucose, capillary     Status: Abnormal   Collection Time:  02/19/18 11:33 AM  Result Value Ref Range   Glucose-Capillary 123 (H) 70 - 99 mg/dL  CBC with Differential/Platelet     Status: Abnormal   Collection Time: 02/19/18  1:05 PM  Result Value Ref Range   WBC 11.7 (H) 4.0 - 10.5 K/uL   RBC 3.70 (L) 3.87 - 5.11 MIL/uL   Hemoglobin 11.1 (L) 12.0 - 15.0 g/dL   HCT 34.0 (L) 36.0 - 46.0 %   MCV 91.9 78.0 - 100.0 fL   MCH 30.0 26.0 - 34.0 pg   MCHC 32.6 30.0 - 36.0 g/dL   RDW 12.8 11.5 - 15.5 %   Platelets 276 150 - 400 K/uL   Neutrophils Relative % 67 %   Neutro Abs 7.9 (H) 1.7 - 7.7 K/uL   Lymphocytes Relative 22 %   Lymphs Abs 2.6 0.7 - 4.0 K/uL   Monocytes Relative 6 %   Monocytes Absolute 0.6 0.1 - 1.0 K/uL   Eosinophils Relative 3 %   Eosinophils Absolute 0.4 0.0 - 0.7 K/uL   Basophils Relative 1 %   Basophils Absolute 0.1 0.0 - 0.1 K/uL   Immature Granulocytes 1 %  Abs Immature Granulocytes 0.1 0.0 - 0.1 K/uL    Comment: Performed at [facility], [place] [address], [place], [place] [address]  Basic metabolic panel     Status: Abnormal   Collection Time: 02/19/18  1:05 PM  Result Value Ref Range   Sodium 144 135 - 145 mmol/L   Potassium 4.1 3.5 - 5.1 mmol/L   Chloride 112 (H) 98 - 111 mmol/L   CO2 17 (L) 22 - 32 mmol/L   Glucose, Bld 179 (H) 70 - 99 mg/dL   BUN 61 (H) 8 - 23 mg/dL   Creatinine, Ser 2.04 (H) 0.44 - 1.00 mg/dL   Calcium 9.3 8.9 - 10.3 mg/dL   GFR calc non Af Amer 21 (L) >60 mL/min   GFR calc Af Amer 25 (L) >60 mL/min    Comment: (NOTE) The eGFR has been calculated using the CKD EPI equation. This calculation has not been validated in all clinical situations. eGFR's persistently <60 mL/min signify possible Chronic Kidney Disease.    Anion gap 15 5 - 15    Comment: Performed at [facility] [address] [place], [place] [address]  Glucose, capillary     Status: Abnormal   Collection Time: 02/19/18  5:06 PM  Result Value Ref Range   Glucose-Capillary 172 (H) 70 - 99 mg/dL  Glucose, capillary      Status: Abnormal   Collection Time: 02/19/18  9:23 PM  Result Value Ref Range   Glucose-Capillary 109 (H) 70 - 99 mg/dL  Glucose, capillary     Status: None   Collection Time: 02/20/18  6:47 AM  Result Value Ref Range   Glucose-Capillary 88 70 - 99 mg/dL     Constitutional: No distress . Vital signs reviewed. HENT: Normocephalic.  Atraumatic. Eyes: EOMI. No discharge. Cardiovascular: RRR. No JVD. Respiratory: CTA bilaterally. Normal effort. GI: BS +. Non-distended. Musc: No edema or tenderness in extremities. Skin:   Intact. Warm and dry. Neuro: Alert and oreinted Motor: 3/5 RUE/RLE 4/5 left deltoid, biceps, triceps, grip  Assessment/Plan: 1. Functional deficits secondary to small left subcortical infarct not noted on MRI which require 3+ hours per day of interdisciplinary therapy in a comprehensive inpatient rehab setting. Physiatrist is providing close team supervision and 24 hour management of active medical problems listed below. Physiatrist and rehab team continue to assess barriers to discharge/monitor patient progress toward functional and medical goals. FIM: Function - Bathing Position: Shower Body parts bathed by patient: Right arm, Chest, Abdomen, Right upper leg, Left upper leg, Left lower leg, Right lower leg, Front perineal area, Buttocks Body parts bathed by helper: Left arm, Back Assist Level: (partial/moderate assist)  Function- Upper Body Dressing/Undressing What is the patient wearing?: Bra, Pull over shirt/dress Bra - Perfomed by patient: Thread/unthread right bra strap, Thread/unthread left bra strap Bra - Perfomed by helper: Hook/unhook bra (pull down sports bra) Pull over shirt/dress - Perfomed by patient: Thread/unthread left sleeve, Thread/unthread right sleeve, Put head through opening, Pull shirt over trunk Pull over shirt/dress - Perfomed by helper: Thread/unthread right sleeve, Thread/unthread left sleeve(long sleeved shirt) Assist Level: (max  assist) Function - Lower Body Dressing/Undressing What is the patient wearing?: Pants, Non-skid slipper socks, AFO, Shoes Position: Wheelchair/chair at sink Underwear - Performed by helper: Thread/unthread right underwear leg, Thread/unthread left underwear leg, Pull underwear up/down Pants- Performed by patient: Thread/unthread right pants leg, Thread/unthread left pants leg Pants- Performed by helper: Pull pants up/down Non-skid slipper socks- Performed by helper: Don/doff right  sock, Don/doff left sock Shoes - Performed by patient: Don/doff left shoe, Fasten left, Fasten right Shoes - Performed by helper: Don/doff right shoe AFO - Performed by helper: Don/doff right AFO Assist for footwear: Partial/moderate assist Assist for lower body dressing: Touching or steadying assistance (Pt > 75%)  Function - Toileting Toileting steps completed by patient: Adjust clothing prior to toileting, Performs perineal hygiene Toileting steps completed by helper: Adjust clothing after toileting Toileting Assistive Devices: Grab bar or rail Assist level: Touching or steadying assistance (Pt.75%)  Function - Air cabin crew transfer assistive device: Grab bar, Elevated toilet seat/BSC over toilet Assist level to toilet: Touching or steadying assistance (Pt > 75%) Assist level from toilet: Touching or steadying assistance (Pt > 75%) Assist level to bedside commode (at bedside): Moderate assist (Pt 50 - 74%/lift or lower)(per Elby Beck, NT and Elmo Putt, NT) Assist level from bedside commode (at bedside): Moderate assist (Pt 50 - 74%/lift or lower)  Function - Chair/bed transfer Chair/bed transfer method: Stand pivot Chair/bed transfer assist level: Touching or steadying assistance (Pt > 75%) Chair/bed transfer assistive device: Armrests, Walker Chair/bed transfer details: Verbal cues for technique, Verbal cues for precautions/safety, Manual facilitation for placement  Function - Locomotion:  Wheelchair Will patient use wheelchair at discharge?: No Type: Manual Max wheelchair distance: 150' Assist Level: Supervision or verbal cues Assist Level: Supervision or verbal cues Assist Level: Supervision or verbal cues Turns around,maneuvers to table,bed, and toilet,negotiates 3% grade,maneuvers on rugs and over doorsills: No Function - Locomotion: Ambulation Assistive device: Walker-rolling Max distance: 65 ft Assist level: Touching or steadying assistance (Pt > 75%) Assist level: Touching or steadying assistance (Pt > 75%) Walk 50 feet with 2 turns activity did not occur: Safety/medical concerns Assist level: Touching or steadying assistance (Pt > 75%) Walk 150 feet activity did not occur: Safety/medical concerns Walk 10 feet on uneven surfaces activity did not occur: Safety/medical concerns  Function - Comprehension Comprehension: Auditory Comprehension assist level: Follows basic conversation/direction with no assist  Function - Expression Expression: Verbal Expression assist level: Expresses basic needs/ideas: With no assist  Function - Social Interaction Social Interaction assist level: Interacts appropriately with others with medication or extra time (anti-anxiety, antidepressant).  Function - Problem Solving Problem solving assist level: Solves basic 90% of the time/requires cueing < 10% of the time  Function - Memory Memory assist level: Recognizes or recalls 90% of the time/requires cueing < 10% of the time Patient normally able to recall (first 3 days only): Current season, Location of own room, Staff names and faces, That he or she is in a hospital  Medical Problem List and Plan: 1.Functional deficits due to RUE>RLE weaknesssecondary to small subcortical infarct  Cont CIR, new plan is for SNF    2. DVT Prophylaxis/Anticoagulation: Pharmaceutical:Lovenox 3. Pain Management:Tylenol prn for HA. Tramadol prn for moderate to severe pain. Kpad and sportscreme  for neck 4. Mood:LCSW to follow for evaluation and and support 5. Neuropsych: This patientiscapable of making decisions onherown behalf. 6. Skin/Wound Care:Routine pressure relief measures 7. Fluids/Electrolytes/Nutrition:Monitor I's and O's.  8.HTN: Monitor: Blood pressures twice daily. Avoid hypotension to allow for adequate perfusion. Will continue Norvasc daily Vitals:   02/19/18 2048 02/20/18 0451  BP: 127/69 (!) 145/74  Pulse: 71 76  Resp: 16 16  Temp: 97.6 F (36.4 C) 97.7 F (36.5 C)  SpO2: 97% 98%   Controlled on 9/17 9.T2DM: We will monitor blood sugars before meals at bedtime. Resumed home dose Glucotrol. CBG (last 3)  Recent Labs    02/19/18 1706 02/19/18 2123 02/20/18 0647  GLUCAP 172* 109* 88  controlled on 9/17 10.Chronic low back pain: Resume Cymbalta. Question tramadol as needed for pain 11.Iron deficiency anemia:   Hemoglobin 10.3 on 9/4  Labs ordered for today 12.CKD stage IV: BUN/SCr 52/1.7 at admission.  Creat 2.05 on 9/13, stable at 2.04 9/16, BUN 61 Continue IVF daily at bedtime    Has nephro f/u in [facility] in Nov may need to move up appt to Oct 13.  Leukocytosis mild no fever cont to monitor recheck in am, per son pt is prone to UTI, incont of strong smelling urine, check UA C and S LOS (Days) 14 A FACE TO FACE EVALUATION WAS PERFORMED  Charlett Blake 02/20/2018, 9:22 AM

## 2018-02-20 NOTE — Progress Notes (Signed)
Occupational Therapy Session Note  Patient Details  Name: Brandy Barnett MRN: [id number] Date of Birth: [birth year]  Today's Date: 02/20/2018 OT Individual Time: 4098-1191 OT Individual Time Calculation (min): 60 min    Short Term Goals: Week 2:  OT Short Term Goal 1 (Week 2): STGs = LTGs  Skilled Therapeutic Interventions/Progress Updates:    LTGS DOWNGRADED FROM MOD I TO SUPERVISION FOR SIT TO STAND, STANDING BALANCE, TOILET TRANSFERS AND TOILETING.   LTG DISCONTINUED FOR HOUSEKEEPING AND LAUNDRY AS PT WILL LIKELY BE GOING TO AN ALF.  IF SHE GOES HOME, SHE WILL NEED A WITH THESE TASKS DUE TO DECREASED STANDING BALANCE AND DIMINISHED RUE STRENGTH. LB DRESSING DOWNGRADED TO MIN A AS PT HAS DIFFICULTY DONNING R SHOE AND AFO.  Pt seen this session for ADL training with a focus on use of RUE and standing balance. Pt received in bed and stated she did not need to toilet but agreeable to working on a toilet transfer prior to shower.  She sat to EOB with S and completed a stand pivot to w.c with touching A.  Touching A to toilet.  Stood with close S with L hand on bar and used R hand to push pants down.  Pt was soaked from brief to pants to shirt.  She did not realize that.  Pt did not need to toilet further.  Transferred to shower bench with min A and was able to actively use RUE to wash all of her body except her back. She stood with bar with steadying A to wash her bottom.  Transferred back to w/c to dress. She had more difficulty with her bra today, but was able to get her shirt on.  Donned pull up briefs and pants over feet and then stood with steadying A as she was able to get clothing over hips 80% of the way actively using her R hand.  She continues to need significant A donning R shoe and AFO.  Pt's son arrived at end of session and updated pt on how pt is progressing. Pt resting in w/c with chair alarm set and all needs met.  Therapy Documentation Precautions:  Precautions Precautions:  Fall Precaution Comments: pt reports several falls Restrictions Weight Bearing Restrictions: No  Pain: Pain Assessment Pain Score: 0-No pain ADL: ADL ADL Comments: refer to functional navigator  See Function Navigator for Current Functional Status.   Therapy/Group: Individual Therapy  SAGUIER,JULIA 02/20/2018, 9:18 AM

## 2018-02-20 NOTE — Progress Notes (Signed)
Occupational Therapy Discharge Summary  Patient Details  Name: Brandy Barnett MRN: [id number] Date of Birth: [birth year]     Patient has met 5 of 12 long term goals due to improved activity tolerance, improved balance, postural control, ability to compensate for deficits, functional use of  RIGHT upper extremity and improved coordination.  Patient to discharge at [facility] Assist level.  Patient's care partner unavailable to provide the necessary physical assistance at discharge.    Reasons goals not met: pt's LTGs were recently downgraded, but she continues to require steadying A for her balance. Therefore she need steadying A with her toileting, toilet transfers, shower transfers, standing balance so she was not able to reach S goals.  Pt will be receiving continues rehab in a SNF so her skills are adequate for discharge.  Recommendation:  Patient will benefit from ongoing skilled OT services in skilled nursing facility setting to continue to advance functional skills in the area of BADL.  Equipment: No equipment provided  Reasons for discharge: treatment goals met and discharge from hospital  Patient/family agrees with progress made and goals achieved: Yes  OT Discharge Precautions/Restrictions  Precautions Precautions: Fall Restrictions Weight Bearing Restrictions: No  ADL ADL ADL Comments: refer to functional navigator Vision Patient Visual Report: No change from baseline Vision Assessment?: No apparent visual deficits Perception  Perception: Within Functional Limits Praxis Praxis: Intact Cognition Overall Cognitive Status: Within Functional Limits for tasks assessed Arousal/Alertness: Awake/alert Orientation Level: Oriented X4 Attention: Focused;Sustained Focused Attention: Appears intact Sustained Attention: Appears intact Memory: Appears intact Awareness: Appears intact Safety/Judgment: Appears intact Sensation Sensation Light Touch: Appears Intact Hot/Cold:  Appears Intact Proprioception: Appears Intact Additional Comments: sensation grossly intact, pt does report some numbness/tingling in R foot Coordination Gross Motor Movements are Fluid and Coordinated: No Fine Motor Movements are Fluid and Coordinated: No Coordination and Movement Description: R sided weakness Heel Shin Test: impaired R LE Motor  Motor Motor: Hemiplegia Motor - Skilled Clinical Observations: mild R hemiparesis Mobility  Bed Mobility Bed Mobility: Supine to Sit;Sit to Supine Supine to Sit: Independent with assistive device Sit to Supine: Independent with assistive device Transfers Sit to Stand: Independent with assistive device Stand to Sit: Supervision/Verbal cueing  Trunk/Postural Assessment  Cervical Assessment Cervical Assessment: Exceptions to WFL(forward head posture) Thoracic Assessment Thoracic Assessment: Exceptions to WFL(rounded shoulders) Lumbar Assessment Lumbar Assessment: Within Functional Limits Postural Control Postural Control: Within Functional Limits  Balance Balance Balance Assessed: Yes Static Sitting Balance Static Sitting - Level of Assistance: 6: Modified independent (Device/Increase time) Dynamic Sitting Balance Dynamic Sitting - Level of Assistance: 6: Modified independent (Device/Increase time) Static Standing Balance Static Standing - Level of Assistance: 6: Modified independent (Device/Increase time) Dynamic Standing Balance Dynamic Standing - Level of Assistance: 5: Stand by assistance;4: Min assist Extremity/Trunk Assessment RUE Assessment Passive Range of Motion (PROM) Comments: WFL Active Range of Motion (AROM) Comments: shoulder flexion 30, elb flex 120, finger flexion and extension 75% of full ROM  General Strength Comments: elbow flex/ext 3-/5, grasp 3-/5 LUE Assessment LUE Assessment: Within Functional Limits   See Function Navigator for Current Functional Status.  [place] 02/20/2018, 1:52 PM

## 2018-02-20 NOTE — Plan of Care (Signed)
  Problem: Consults Goal: RH STROKE PATIENT EDUCATION Description See Patient Education module for education specifics  Outcome: Completed/Met Goal: Nutrition Consult-if indicated Outcome: Completed/Met Goal: Diabetes Guidelines if Diabetic/Glucose > 140 Description If diabetic or lab glucose is > 140 mg/dl - Initiate Diabetes/Hyperglycemia Guidelines & Document Interventions  Outcome: Completed/Met   Problem: RH BOWEL ELIMINATION Goal: RH STG MANAGE BOWEL WITH ASSISTANCE Description STG Manage Bowel with  Min Assistance.  Outcome: Completed/Met   Problem: RH BLADDER ELIMINATION Goal: RH STG MANAGE BLADDER WITH ASSISTANCE Description STG Manage Bladder With min Assistance  Outcome: Completed/Met   Problem: RH SAFETY Goal: RH STG ADHERE TO SAFETY PRECAUTIONS W/ASSISTANCE/DEVICE Description STG Adhere to Safety Precautions With  Min Assistance/Device.  Outcome: Completed/Met   Problem: RH COGNITION-NURSING Goal: RH STG USES MEMORY AIDS/STRATEGIES W/ASSIST TO PROBLEM SOLVE Description STG Uses Memory Aids/Strategies With mod I Assistance to Problem Solve.  Outcome: Completed/Met   Problem: RH KNOWLEDGE DEFICIT Goal: RH STG INCREASE KNOWLEDGE OF DIABETES Description Pt will be able to explain management of DM with medications and diet using cues/reminders Outcome: Completed/Met Goal: RH STG INCREASE KNOWLEDGE OF HYPERTENSION Description Pt will be able to explain management of HTn using medications and diet with cues/reminders Outcome: Completed/Met Goal: RH STG INCREASE KNOWLEDGE OF STROKE PROPHYLAXIS Description Pt will be able to explain stroke prevention and medications using cues\/reminders Outcome: Completed/Met

## 2018-02-21 ENCOUNTER — Inpatient Hospital Stay (HOSPITAL_COMMUNITY): Payer: Self-pay

## 2018-02-21 DIAGNOSIS — N390 Urinary tract infection, site not specified: Secondary | ICD-10-CM

## 2018-02-21 LAB — BASIC METABOLIC PANEL
Anion gap: 11 (ref 5–15)
BUN: 51 mg/dL — ABNORMAL HIGH (ref 8–23)
CALCIUM: 8.6 mg/dL — AB (ref 8.9–10.3)
CO2: 19 mmol/L — ABNORMAL LOW (ref 22–32)
Chloride: 112 mmol/L — ABNORMAL HIGH (ref 98–111)
Creatinine, Ser: 1.75 mg/dL — ABNORMAL HIGH (ref 0.44–1.00)
GFR, EST AFRICAN AMERICAN: 30 mL/min — AB (ref >60)
GFR, EST NON AFRICAN AMERICAN: 26 mL/min — AB (ref >60)
Glucose, Bld: 100 mg/dL — ABNORMAL HIGH (ref 70–99)
POTASSIUM: 4.2 mmol/L (ref 3.5–5.1)
Sodium: 142 mmol/L (ref 135–145)

## 2018-02-21 LAB — GLUCOSE, CAPILLARY
GLUCOSE-CAPILLARY: 98 mg/dL (ref 70–99)
GLUCOSE-CAPILLARY: 112 mg/dL — AB (ref 70–99)

## 2018-02-21 LAB — CBC
HCT: 29.8 % — ABNORMAL LOW (ref 36.0–46.0)
Hemoglobin: 9.7 g/dL — ABNORMAL LOW (ref 12.0–15.0)
MCH: 29.7 pg (ref 26.0–34.0)
MCHC: 32.6 g/dL (ref 30.0–36.0)
MCV: 91.1 fL (ref 78.0–100.0)
Platelets: 238 10*3/uL (ref 150–400)
RBC: 3.27 MIL/uL — AB (ref 3.87–5.11)
RDW: 12.6 % (ref 11.5–15.5)
WBC: 9.1 10*3/uL (ref 4.0–10.5)

## 2018-02-21 MED ORDER — TRAMADOL HCL 50 MG PO TABS: 50.0000 mg | ORAL_TABLET | Freq: Every day | ORAL | 0 refills | Status: DC | PRN

## 2018-02-21 MED ORDER — CEPHALEXIN 250 MG PO CAPS: 250.0000 mg | ORAL_CAPSULE | Freq: Two times a day (BID) | ORAL | Status: DC

## 2018-02-21 NOTE — Progress Notes (Signed)
Subjective/Complaints: Urinalysis positive for UTI, gram-negative rods and culture, started on Keflex      Review of systems: Denies CP, SOB, N/V/D  Objective: Vital Signs: Blood pressure 137/68, pulse 70, temperature 98.3 F (36.8 C), resp. rate 16, height 5' 2" (1.575 m), weight 65.5 kg, SpO2 98 %. No results found. Results for orders placed or performed during the hospital encounter of 02/06/18 (from the past 72 hour(s))  Glucose, capillary     Status: Abnormal   Collection Time: 02/18/18  4:33 PM  Result Value Ref Range   Glucose-Capillary 117 (H) 70 - 99 mg/dL  Glucose, capillary     Status: None   Collection Time: 02/18/18  9:51 PM  Result Value Ref Range   Glucose-Capillary 85 70 - 99 mg/dL   Comment 1 Notify RN   Glucose, capillary     Status: None   Collection Time: 02/19/18  6:26 AM  Result Value Ref Range   Glucose-Capillary 98 70 - 99 mg/dL   Comment 1 Notify RN   Glucose, capillary     Status: Abnormal   Collection Time: 02/19/18  7:55 AM  Result Value Ref Range   Glucose-Capillary 102 (H) 70 - 99 mg/dL   Comment 1 Notify RN   Glucose, capillary     Status: Abnormal   Collection Time: 02/19/18 11:33 AM  Result Value Ref Range   Glucose-Capillary 123 (H) 70 - 99 mg/dL  CBC with Differential/Platelet     Status: Abnormal   Collection Time: 02/19/18  1:05 PM  Result Value Ref Range   WBC 11.7 (H) 4.0 - 10.5 K/uL   RBC 3.70 (L) 3.87 - 5.11 MIL/uL   Hemoglobin 11.1 (L) 12.0 - 15.0 g/dL   HCT 34.0 (L) 36.0 - 46.0 %   MCV 91.9 78.0 - 100.0 fL   MCH 30.0 26.0 - 34.0 pg   MCHC 32.6 30.0 - 36.0 g/dL   RDW 12.8 11.5 - 15.5 %   Platelets 276 150 - 400 K/uL   Neutrophils Relative % 67 %   Neutro Abs 7.9 (H) 1.7 - 7.7 K/uL   Lymphocytes Relative 22 %   Lymphs Abs 2.6 0.7 - 4.0 K/uL   Monocytes Relative 6 %   Monocytes Absolute 0.6 0.1 - 1.0 K/uL   Eosinophils Relative 3 %   Eosinophils Absolute 0.4 0.0 - 0.7 K/uL   Basophils Relative 1 %   Basophils Absolute 0.1  0.0 - 0.1 K/uL   Immature Granulocytes 1 %   Abs Immature Granulocytes 0.1 0.0 - 0.1 K/uL    Comment: Performed at [facility], [address], [place], [place] [address]  Basic metabolic panel     Status: Abnormal   Collection Time: 02/19/18  1:05 PM  Result Value Ref Range   Sodium 144 135 - 145 mmol/L   Potassium 4.1 3.5 - 5.1 mmol/L   Chloride 112 (H) 98 - 111 mmol/L   CO2 17 (L) 22 - 32 mmol/L   Glucose, Bld 179 (H) 70 - 99 mg/dL   BUN 61 (H) 8 - 23 mg/dL   Creatinine, Ser 2.04 (H) 0.44 - 1.00 mg/dL   Calcium 9.3 8.9 - 10.3 mg/dL   GFR calc non Af Amer 21 (L) >60 mL/min   GFR calc Af Amer 25 (L) >60 mL/min    Comment: (NOTE) The eGFR has been calculated using the CKD EPI equation. This calculation has not been validated in all clinical situations. eGFR's persistently <60 mL/min signify  possible Chronic Kidney Disease.    Anion gap 15 5 - 15    Comment: Performed at [facility] [address], [place], [place] [address]  Glucose, capillary     Status: Abnormal   Collection Time: 02/19/18  5:06 PM  Result Value Ref Range   Glucose-Capillary 172 (H) 70 - 99 mg/dL  Glucose, capillary     Status: Abnormal   Collection Time: 02/19/18  9:23 PM  Result Value Ref Range   Glucose-Capillary 109 (H) 70 - 99 mg/dL  Glucose, capillary     Status: None   Collection Time: 02/20/18  6:47 AM  Result Value Ref Range   Glucose-Capillary 88 70 - 99 mg/dL  Glucose, capillary     Status: Abnormal   Collection Time: 02/20/18 11:53 AM  Result Value Ref Range   Glucose-Capillary 115 (H) 70 - 99 mg/dL  Urinalysis, Routine w reflex microscopic     Status: Abnormal   Collection Time: 02/20/18 12:56 PM  Result Value Ref Range   Color, Urine YELLOW YELLOW   APPearance TURBID (A) CLEAR   Specific Gravity, Urine 1.012 1.005 - 1.030   pH 5.0 5.0 - 8.0   Glucose, UA NEGATIVE NEGATIVE mg/dL   Hgb urine dipstick NEGATIVE NEGATIVE   Bilirubin Urine NEGATIVE NEGATIVE   Ketones, ur  NEGATIVE NEGATIVE mg/dL   Protein, ur 30 (A) NEGATIVE mg/dL   Nitrite POSITIVE (A) NEGATIVE   Leukocytes, UA LARGE (A) NEGATIVE   WBC, UA >50 (H) 0 - 5 WBC/hpf   Bacteria, UA MANY (A) NONE SEEN   WBC Clumps PRESENT     Comment: Performed at [facility], [address], [place], [place] [address]  Urine Culture     Status: Abnormal (Preliminary result)   Collection Time: 02/20/18 12:56 PM  Result Value Ref Range   Specimen Description URINE, CLEAN CATCH    Special Requests      NONE Performed at [facility], [place] [address], [place], [place] [address]    Culture >=100,000 COLONIES/mL GRAM NEGATIVE RODS (A)    Report Status PENDING   Glucose, capillary     Status: Abnormal   Collection Time: 02/20/18  4:38 PM  Result Value Ref Range   Glucose-Capillary 107 (H) 70 - 99 mg/dL  Glucose, capillary     Status: Abnormal   Collection Time: 02/20/18  9:12 PM  Result Value Ref Range   Glucose-Capillary 126 (H) 70 - 99 mg/dL  Basic metabolic panel     Status: Abnormal   Collection Time: 02/21/18  5:29 AM  Result Value Ref Range   Sodium 142 135 - 145 mmol/L   Potassium 4.2 3.5 - 5.1 mmol/L   Chloride 112 (H) 98 - 111 mmol/L   CO2 19 (L) 22 - 32 mmol/L   Glucose, Bld 100 (H) 70 - 99 mg/dL   BUN 51 (H) 8 - 23 mg/dL   Creatinine, Ser 1.75 (H) 0.44 - 1.00 mg/dL   Calcium 8.6 (L) 8.9 - 10.3 mg/dL   GFR calc non Af Amer 26 (L) >60 mL/min   GFR calc Af Amer 30 (L) >60 mL/min    Comment: (NOTE) The eGFR has been calculated using the CKD EPI equation. This calculation has not been validated in all clinical situations. eGFR's persistently <60 mL/min signify possible Chronic Kidney Disease.    Anion gap 11 5 - 15    Comment: Performed at [facility] [address],  [place], [place] [address]  CBC     Status: Abnormal   Collection Time: 02/21/18  5:29 AM  Result Value Ref Range   WBC 9.1 4.0 - 10.5 K/uL   RBC 3.27 (L) 3.87 - 5.11 MIL/uL   Hemoglobin 9.7 (L) 12.0 -  15.0 g/dL   HCT 29.8 (L) 36.0 - 46.0 %   MCV 91.1 78.0 - 100.0 fL   MCH 29.7 26.0 - 34.0 pg   MCHC 32.6 30.0 - 36.0 g/dL   RDW 12.6 11.5 - 15.5 %   Platelets 238 150 - 400 K/uL    Comment: Performed at [facility] [address] [place], [place] [address]  Glucose, capillary     Status: None   Collection Time: 02/21/18  6:36 AM  Result Value Ref Range   Glucose-Capillary 98 70 - 99 mg/dL  Glucose, capillary     Status: Abnormal   Collection Time: 02/21/18 11:36 AM  Result Value Ref Range   Glucose-Capillary 112 (H) 70 - 99 mg/dL     Constitutional: No distress . Vital signs reviewed. HENT: Normocephalic.  Atraumatic. Eyes: EOMI. No discharge. Cardiovascular: RRR. No JVD. Respiratory: CTA bilaterally. Normal effort. GI: BS +. Non-distended. Musc: No edema or tenderness in extremities. Skin:   Intact. Warm and dry. Neuro: Alert and oreinted Motor: 3/5 RUE/RLE 4/5 left deltoid, biceps, triceps, grip  Assessment/Plan: 1. Functional deficits secondary to small left subcortical infarct not noted on MRI Stable for D/C today F/u PCP in 3-4 weeks F/u PM&R 2 weeks See D/C summary See D/C instructions FIM: Function - Bathing Position: Shower Body parts bathed by patient: Right arm, Chest, Abdomen, Right upper leg, Left upper leg, Left lower leg, Right lower leg, Front perineal area, Buttocks, Left arm Body parts bathed by helper: Back Assist Level: Touching or steadying assistance(Pt > 75%)  Function- Upper Body Dressing/Undressing What is the patient wearing?: Bra, Pull over shirt/dress Bra - Perfomed by patient: Thread/unthread right bra strap Bra - Perfomed by helper: Thread/unthread left bra strap, Hook/unhook bra (pull down sports bra) Pull over shirt/dress - Perfomed by patient: Thread/unthread left sleeve, Thread/unthread right sleeve, Put head through opening, Pull shirt over trunk Pull over shirt/dress - Perfomed by helper: Thread/unthread right sleeve,  Thread/unthread left sleeve(long sleeved shirt) Assist Level: (max assist) Function - Lower Body Dressing/Undressing What is the patient wearing?: Pants, Non-skid slipper socks, AFO, Shoes, Underwear Position: Wheelchair/chair at sink Underwear - Performed by patient: Thread/unthread right underwear leg, Thread/unthread left underwear leg Underwear - Performed by helper: Pull underwear up/down Pants- Performed by patient: Thread/unthread right pants leg, Thread/unthread left pants leg Pants- Performed by helper: Pull pants up/down Non-skid slipper socks- Performed by patient: Don/doff left sock Non-skid slipper socks- Performed by helper: Don/doff right sock Shoes - Performed by patient: Fasten left, Don/doff left shoe Shoes - Performed by helper: Don/doff right shoe, Fasten right AFO - Performed by helper: Don/doff right AFO Assist for footwear: Partial/moderate assist Assist for lower body dressing: Touching or steadying assistance (Pt > 75%)  Function - Toileting Toileting steps completed by patient: Performs perineal hygiene Toileting steps completed by helper: Adjust clothing prior to toileting, Adjust clothing after toileting Toileting Assistive Devices: Grab bar or rail Assist level: Touching or steadying assistance (Pt.75%)  Function - Air cabin crew transfer assistive device: Grab bar, Elevated toilet seat/BSC over toilet Assist level to toilet: Touching or steadying assistance (Pt > 75%) Assist level from toilet: Touching or steadying assistance (Pt > 75%) Assist level  to bedside commode (at bedside): Moderate assist (Pt 50 - 74%/lift or lower)(per Elby Beck, NT and Elmo Putt, NT) Assist level from bedside commode (at bedside): Moderate assist (Pt 50 - 74%/lift or lower)  Function - Chair/bed transfer Chair/bed transfer method: Stand pivot Chair/bed transfer assist level: Touching or steadying assistance (Pt > 75%) Chair/bed transfer assistive device:  Armrests Chair/bed transfer details: Verbal cues for technique, Verbal cues for precautions/safety  Function - Locomotion: Wheelchair Will patient use wheelchair at discharge?: No(pt is primary ambulator) Type: Manual Max wheelchair distance: 150' Assist Level: Supervision or verbal cues Assist Level: Supervision or verbal cues Assist Level: Supervision or verbal cues Turns around,maneuvers to table,bed, and toilet,negotiates 3% grade,maneuvers on rugs and over doorsills: No Function - Locomotion: Ambulation Assistive device: Walker-rolling Max distance: 150' Assist level: Touching or steadying assistance (Pt > 75%) Assist level: Supervision or verbal cues Walk 50 feet with 2 turns activity did not occur: Safety/medical concerns Assist level: Supervision or verbal cues Walk 150 feet activity did not occur: Safety/medical concerns Assist level: Touching or steadying assistance (Pt > 75%) Walk 10 feet on uneven surfaces activity did not occur: Safety/medical concerns  Function - Comprehension Comprehension: Auditory Comprehension assist level: Follows basic conversation/direction with no assist  Function - Expression Expression: Verbal Expression assist level: Expresses basic needs/ideas: With no assist  Function - Social Interaction Social Interaction assist level: Interacts appropriately with others with medication or extra time (anti-anxiety, antidepressant).  Function - Problem Solving Problem solving assist level: Solves basic 90% of the time/requires cueing < 10% of the time  Function - Memory Memory assist level: Recognizes or recalls 90% of the time/requires cueing < 10% of the time Patient normally able to recall (first 3 days only): Current season, Location of own room, Staff names and faces, That he or she is in a hospital  Medical Problem List and Plan: 1.Functional deficits due to RUE>RLE weaknesssecondary to small subcortical infarct  Plan for transfer to  skilled facility today 2. DVT Prophylaxis/Anticoagulation: Pharmaceutical:Lovenox 3. Pain Management:Tylenol prn for HA. Tramadol prn for moderate to severe pain. Kpad and sportscreme for neck 4. Mood:LCSW to follow for evaluation and and support 5. Neuropsych: This patientiscapable of making decisions onherown behalf. 6. Skin/Wound Care:Routine pressure relief measures 7. Fluids/Electrolytes/Nutrition:Monitor I's and O's.  8.HTN: Monitor: Blood pressures twice daily. Avoid hypotension to allow for adequate perfusion. Will continue Norvasc daily Vitals:   02/20/18 2051 02/21/18 0439  BP: (!) 108/57 137/68  Pulse: 80 70  Resp: 16 16  Temp: 97.8 F (36.6 C) 98.3 F (36.8 C)  SpO2: 95% 98%   Controlled on 9/18 9.T2DM: We will monitor blood sugars before meals at bedtime. Resumed home dose Glucotrol. CBG (last 3)   Recent Labs    02/20/18 2112 02/21/18 0636 02/21/18 1136  GLUCAP 126* 98 112*  controlled on 9/18 10.Chronic low back pain: Resume Cymbalta. Question tramadol as needed for pain 11.Iron deficiency anemia:   Hemoglobin 10.3 on 9/4  Labs ordered for today 12.CKD stage IV: BUN/SCr 52/1.7 at admission.  Creat 2.05 on 9/13, stable at 2.04 9/16, BUN 61 Continue IVF daily at bedtime    Has nephro f/u in [facility] in Nov may need to move up appt to Oct 13.  Leukocytosis secondary to gram-negative UTI now on Keflex LOS (Days) 15 A FACE TO FACE EVALUATION WAS PERFORMED  Charlett Blake 02/21/2018, 1:06 PM

## 2018-02-21 NOTE — Progress Notes (Signed)
Social Work  Discharge Note  The overall goal for the admission was met for:   Discharge location: Yes-[facility]-SNF  Length of Stay: Yes-15 DAYS  Discharge activity level: Yes-MIN ASSIST LEVEL  Home/community participation: Yes  Services provided included: MD, RD, PT, OT, RN, CM, Pharmacy and SW  Financial Services: Medicare and Private Insurance: [name]  Follow-up services arranged: Other: NHP  Comments (or additional information):SON FELT NEEDED MORE REHAB BEFORE RETURNING HOME WITH HIM AND HIS WIFE.  Patient/Family verbalized understanding of follow-up arrangements: Yes  Individual responsible for coordination of the follow-up plan: BILLY-SON  Confirmed correct DME delivered: Elease Hashimoto 02/21/2018    Elease Hashimoto

## 2018-02-21 NOTE — Progress Notes (Signed)
Nurse called report to SNF. Pt discharged to SNF via PTAR. Pt has all belongings with her.

## 2018-02-22 LAB — URINE CULTURE

## 2018-03-27 ENCOUNTER — Inpatient Hospital Stay: Payer: BLUE CROSS/BLUE SHIELD | Admitting: Physical Medicine & Rehabilitation

## 2018-03-27 ENCOUNTER — Ambulatory Visit: Payer: BLUE CROSS/BLUE SHIELD | Admitting: Adult Health

## 2018-03-27 ENCOUNTER — Telehealth: Payer: Self-pay

## 2018-03-27 NOTE — Progress Notes (Deleted)
[facility] [address] [place]. [place] [address] [phone]       OFFICE FOLLOW UP NOTE  Ms. Brandy Barnett Date of Birth:  20-Jun-1932 Medical Record Number:  [id number]   Reason for Referral:  hospital stroke follow up  CHIEF COMPLAINT:  No chief complaint on file.   HPI: Brandy Barnett is being seen today for initial visit in the office for possible small left subcortical infarct s/p TPA on 02/02/2018. History obtained from *** and chart review. Reviewed all radiology images and labs personally.  Brandy S Priceis a 84 Barnett history of previous stroke without residual deficits, hypertension, orthostatic hypotension, diabetes mellitus, and thyroid diseasewho presented with transient loss of consciousness and right-sided weakness s/p IV tPA Friday 02/02/2018 at 21:15.  She tolerated TPA without difficulty.  CT head was negative for acute abnormality.  MRI brain reviewed and did not show acute infarct but a small left subcortical stroke is suspected as symptoms did not resolve, likely too small to be seen on MRI.  CTA head and neck was negative for large vessel occlusion.  2D echo showed an EF of 60 to 65% without cardiac source of embolus.  LDL 34 and recommended continued Crestor 10 mg daily.  HTN stable during admission recommended long-term BP goal normotensive range.  A1c 6 and recommended continued follow-up with PCP for DM management.  Patient was on aspirin 81 mg PTA and recommended aspirin 325 mg at discharge.  She was discharged to [facility] for continued therapies.  She was discharged to [facility] on 02/21/2018 as she requires supervision with cues for transfers and assistance with ADLs and family was unable to provide care needed.     ROS:   14 system review of systems performed and negative with exception of ***  PMH:  Past Medical History:  Diagnosis Date  . Chronic kidney disease   . Diabetes mellitus   . Hypertension   . Iron deficiency anemia    . Low back pain   . Stroke (HCC)   . Thyroid disease     PSH:  Past Surgical History:  Procedure Laterality Date  . CATARACT EXTRACTION     with lens implant for mono vision  . TOTAL ABDOMINAL HYSTERECTOMY    . TOTAL HIP ARTHROPLASTY Right 1990's    Social History:  Social History   Socioeconomic History  . Marital status: Widowed    Spouse name: Not on file  . Number of children: Not on file  . Years of education: Not on file  . Highest education level: Not on file  Occupational History  . Not on file  Social Needs  . Financial resource strain: Not on file  . Food insecurity:    Worry: Not on file    Inability: Not on file  . Transportation needs:    Medical: Not on file    Non-medical: Not on file  Tobacco Use  . Smoking status: Never Smoker  . Smokeless tobacco: Never Used  Substance and Sexual Activity  . Alcohol use: No  . Drug use: Not on file  . Sexual activity: Not on file  Lifestyle  . Physical activity:    Days per week: Not on file    Minutes per session: Not on file  . Stress: Not on file  Relationships  . Social connections:    Talks on phone: Not on file    Gets together: Not on file    Attends religious service: Not  on file    Active member of club or organization: Not on file    Attends meetings of clubs or organizations: Not on file    Relationship status: Not on file  . Intimate partner violence:    Fear of current or ex partner: Not on file    Emotionally abused: Not on file    Physically abused: Not on file    Forced sexual activity: Not on file  Other Topics Concern  . Not on file  Social History Narrative  . Not on file    Family History:  Family History  Problem Relation Age of Onset  . Diabetes Mother   . Cancer Mother   . Cancer Father   . Diabetes Brother     Medications:   Current Outpatient Medications on File Prior to Visit  Medication Sig Dispense Refill  . acetaminophen (TYLENOL) 325 MG tablet Take 1-2  tablets (325-650 mg total) by mouth every 4 (four) hours as needed for mild pain.    Marland Kitchen albuterol (PROVENTIL HFA;VENTOLIN HFA) 108 (90 Base) MCG/ACT inhaler Inhale 2 puffs into the lungs every 6 (six) hours as needed for wheezing or shortness of breath.    Marland Kitchen amLODipine (NORVASC) 5 MG tablet Take 1 tablet (5 mg total) by mouth daily.    . Ascorbic Acid (VITAMIN C) 1000 MG tablet Take 1,000 mg by mouth daily.    Marland Kitchen aspirin 325 MG tablet Take 1 tablet (325 mg total) by mouth daily.    . cephALEXin (KEFLEX) 250 MG capsule Take 1 capsule (250 mg total) by mouth every 12 (twelve) hours.    Marland Kitchen glipiZIDE (GLUCOTROL XL) 2.5 MG 24 hr tablet Take 2.5 mg by mouth daily.  1  . Menthol-Methyl Salicylate (MUSCLE RUB) 10-15 % CREA Apply 1 application topically 2 (two) times daily. To neck  0  . Multiple Vitamins-Minerals (PRESERVISION AREDS 2) CAPS Take 1 capsule by mouth 2 (two) times daily.    . rosuvastatin (CRESTOR) 10 MG tablet Take 10 mg by mouth every evening.  3  . senna-docusate (SENOKOT-S) 8.6-50 MG tablet Take 1 tablet by mouth at bedtime as needed for mild constipation.    . sevelamer carbonate (RENVELA) 800 MG tablet Take 800 mg by mouth daily with lunch.  0  . traMADol (ULTRAM) 50 MG tablet Take 1 tablet (50 mg total) by mouth daily as needed (pain). 5 tablet 0   No current facility-administered medications on file prior to visit.     Allergies:  No Known Allergies   Physical Exam  There were no vitals filed for this visit. There is no height or weight on file to calculate BMI. No exam data present  General: well developed, well nourished, seated, in no evident distress Head: head normocephalic and atraumatic.   Neck: supple with no carotid or supraclavicular bruits Cardiovascular: regular rate and rhythm, no murmurs Musculoskeletal: no deformity Skin:  no rash/petichiae Vascular:  Normal pulses all extremities  Neurologic Exam Mental Status: Awake and fully alert. Oriented to place and  time. Recent and remote memory intact. Attention span, concentration and fund of knowledge appropriate. Mood and affect appropriate.  Cranial Nerves: Fundoscopic exam reveals sharp disc margins. Pupils equal, briskly reactive to light. Extraocular movements full without nystagmus. Visual fields full to confrontation. Hearing intact. Facial sensation intact. Face, tongue, palate moves normally and symmetrically.  Motor: Normal bulk and tone. Normal strength in all tested extremity muscles. Sensory.: intact to touch , pinprick , position and vibratory sensation.  Coordination: Rapid alternating movements normal in all extremities. Finger-to-nose and heel-to-shin performed accurately bilaterally. Gait and Station: Arises from chair without difficulty. Stance is normal. Gait demonstrates normal stride length and balance . Able to heel, toe and tandem walk without difficulty.  Reflexes: 1+ and symmetric. Toes downgoing.    NIHSS  *** Modified Rankin  *** HAS-BLED *** CHA2DS2-VASc ***   Diagnostic Data (Labs, Imaging, Testing)  Ct Head Code Stroke Wo Contrast 02/02/2018 1. No intracranial hemorrhage. Areas of hyperdensity at the midbrain are slightly increased compared to 12/12/2010 and likely due to a degree of parenchymal mineralization.  2. ASPECTS is 10.   Ct Angio Head W Or Wo Contrast Ct Angio Neck W Or Wo Contrast 02/02/2018 1. Negative CTA for large vessel occlusion. No hemodynamically significant or correctable stenosis identified.  2. Mild atherosclerotic change about the carotid bifurcations and carotid siphons for patient age.  3. Diffuse vascular tortuosity, suggesting chronic underlying hypertension.   Ct C-spine No Charge 02/02/2018 1. No acute fracture or static subluxation of the cervical spine.  2. Severe multilevel facet arthrosis.   Dg Chest Port 1 View 02/02/2018 1. Cardiomegaly and chronic interstitial changes.  2. Moderate hiatal hernia.  3. Aortic  Atherosclerosis (ICD10-I70.0).   Transthoracic Echocardiogram - Left ventricle: The cavity size was normal. Systolic function wasnormal. The estimated ejection fraction was in the range of 60%to 65%. Wall motion was normal; there were no regional wallmotion abnormalities. Doppler parameters are consistent withabnormal left ventricular relaxation (grade 1 diastolicdysfunction). - Pulmonary arteries: Systolic pressure was mildly increased. PApeak pressure: 39 mm Hg (S).    ASSESSMENT: Brandy Barnett is a 84 y.o. year old female here with small left subcortical infarct not visible on MRI s/p IV tPA on 02/02/2018. Vascular risk factors include prior infarcts, HTN, DM and HLD.     PLAN: -Continue {anticoagulants:31417}  and ***  for secondary stroke prevention -F/u with PCP regarding your *** management -continue to monitor BP at home -advised to continue to stay active and maintain a healthy diet -Maintain strict control of hypertension with blood pressure goal below 130/90, diabetes with hemoglobin A1c goal below 6.5% and cholesterol with LDL cholesterol (bad cholesterol) goal below 70 mg/dL. I also advised the patient to eat a healthy diet with plenty of whole grains, cereals, fruits and vegetables, exercise regularly and maintain ideal body weight.  Follow up in *** or call earlier if needed   Greater than 50% of time during this 25 minute visit was spent on counseling,explanation of diagnosis of ***, reviewing risk factor management of ***, planning of further management, discussion with patient and family and coordination of care    George Hugh, [facility]  [facility] [address] [place], [place] [address]  Phone [phone] Fax [phone] Note: This document was prepared with digital dictation and possible smart phrase technology. Any transcriptional errors that result from this process are unintentional.

## 2018-03-27 NOTE — Telephone Encounter (Signed)
Patient no show for appt today. 

## 2018-03-28 ENCOUNTER — Encounter: Payer: Self-pay | Admitting: Adult Health

## 2018-04-24 ENCOUNTER — Other Ambulatory Visit: Payer: Self-pay

## 2018-04-24 NOTE — Patient Outreach (Signed)
First attempt to obtain mRs. No answer. Left message for return call. No DPR on file.  

## 2018-04-30 ENCOUNTER — Other Ambulatory Visit: Payer: Self-pay

## 2018-04-30 NOTE — Patient Outreach (Signed)
Second attempt to obtain mRs. No answer. Left message for return call.  

## 2018-05-09 NOTE — Patient Outreach (Signed)
Spoke with patient's son Chrissie NoaWilliam regarding phone number on file for patient. He states the number listed is his and he does not know his mother's phone number. She does not have any DPR on file so I am unable to get in contact with the patient to obtain an mRs score. MRs=7

## 2019-01-29 IMAGING — DX DG FINGER MIDDLE 2+V*R*
1 series · 3 of 3 positions shown · non-contrast
Comparison: No recent prior.

CLINICAL DATA: Recent fall.  Pain.

EXAM:
RIGHT MIDDLE FINGER 2+V

[Series 1: finger · 0.14mm/px · 3 of 3 slices shown]
[im 1/3]
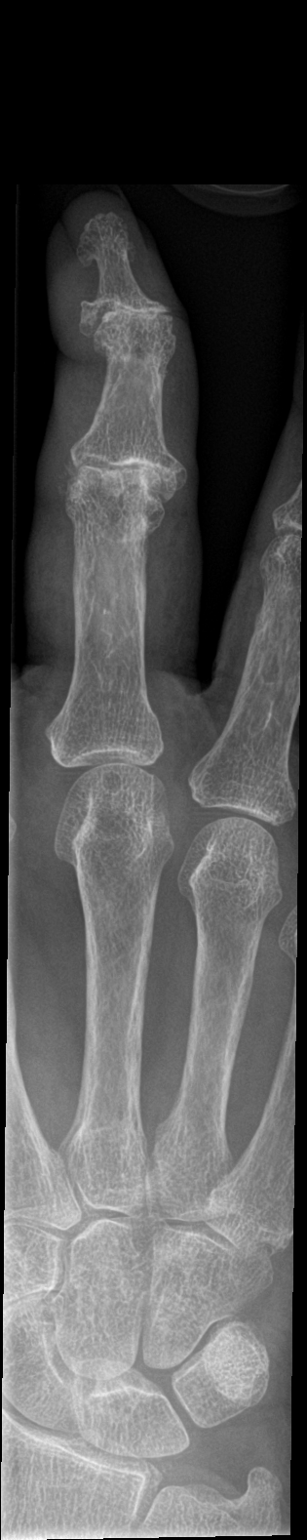
[im 2/3]
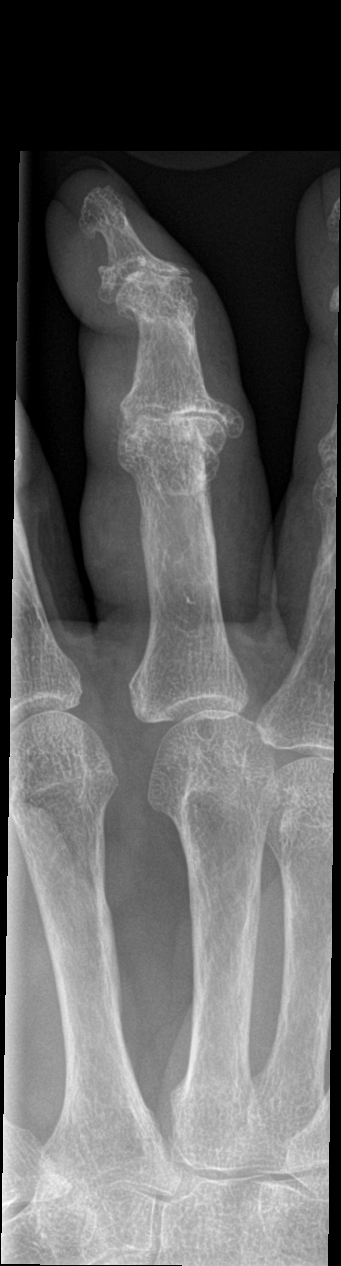
[im 3/3]
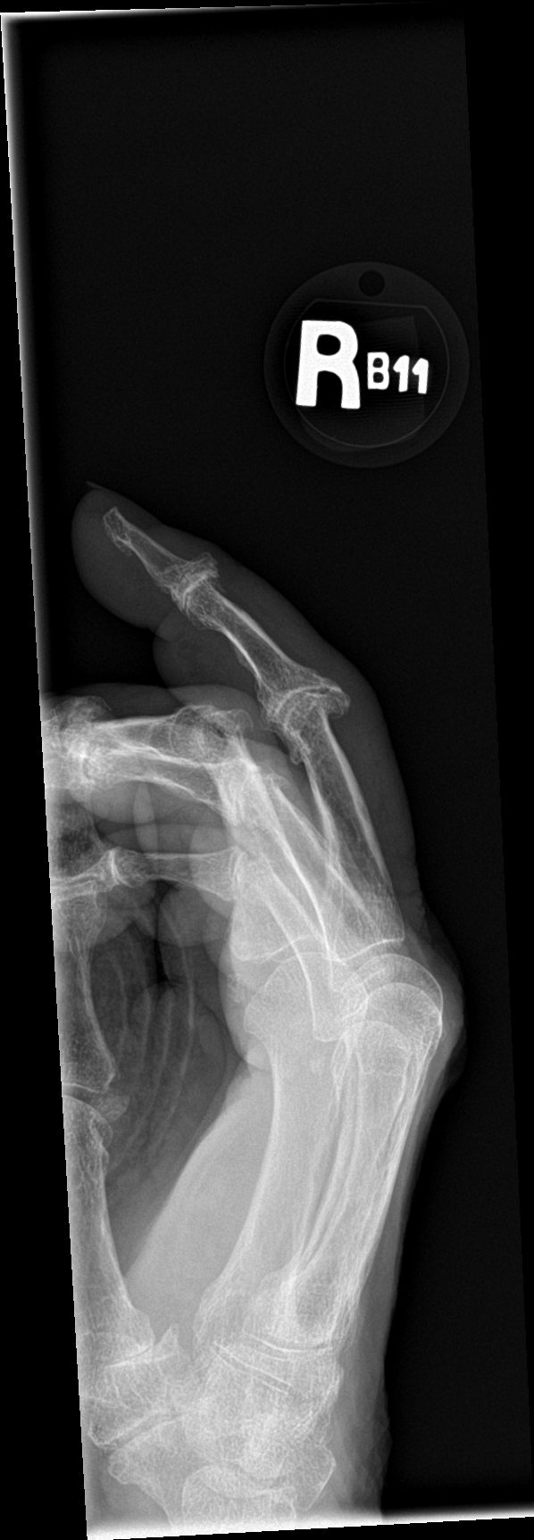

[3 of 3 positions shown; findings below may reference images not displayed]

FINDINGS: Diffuse severe degenerative change. No acute bony abnormality
identified. No evidence of fracture.
IMPRESSION: Diffuse severe degenerative change.  No acute abnormality.

## 2019-07-08 DEATH — deceased
